# Patient Record
Sex: Male | Born: 1975 | Race: Black or African American | Hispanic: No | Marital: Single | Smoking: Current every day smoker
Health system: Southern US, Community
[De-identification: ages and names within clinical notes are randomized; demographics above are authoritative.]

## PROBLEM LIST (undated history)

## (undated) DIAGNOSIS — R569 Unspecified convulsions: Secondary | ICD-10-CM

## (undated) HISTORY — DX: Unspecified convulsions: R56.9

---

## 2007-12-31 ENCOUNTER — Emergency Department (HOSPITAL_COMMUNITY): Admission: EM | Admit: 2007-12-31 | Discharge: 2007-12-31 | Payer: Self-pay | Admitting: Emergency Medicine

## 2008-06-14 ENCOUNTER — Ambulatory Visit (HOSPITAL_COMMUNITY): Admission: RE | Admit: 2008-06-14 | Discharge: 2008-06-14 | Payer: Self-pay | Admitting: Neurology

## 2008-11-09 ENCOUNTER — Emergency Department (HOSPITAL_COMMUNITY): Admission: EM | Admit: 2008-11-09 | Discharge: 2008-11-10 | Payer: Self-pay | Admitting: Emergency Medicine

## 2010-04-16 ENCOUNTER — Encounter: Payer: Self-pay | Admitting: Neurology

## 2010-07-01 LAB — GLUCOSE, CAPILLARY: Glucose-Capillary: 96 mg/dL (ref 70–99)

## 2010-12-26 LAB — RAPID URINE DRUG SCREEN, HOSP PERFORMED
Barbiturates: NOT DETECTED
Benzodiazepines: NOT DETECTED
Cocaine: NOT DETECTED
Opiates: NOT DETECTED

## 2010-12-26 LAB — POCT I-STAT, CHEM 8
BUN: 6
Calcium, Ion: 1.16
Chloride: 105
Creatinine, Ser: 1.2
Glucose, Bld: 100 — ABNORMAL HIGH
HCT: 47
Hemoglobin: 16
Potassium: 3.7
Sodium: 140
TCO2: 29

## 2010-12-26 LAB — URINE MICROSCOPIC-ADD ON

## 2010-12-26 LAB — URINE CULTURE
Colony Count: NO GROWTH
Culture: NO GROWTH

## 2010-12-26 LAB — URINALYSIS, ROUTINE W REFLEX MICROSCOPIC
Nitrite: NEGATIVE
Protein, ur: 100 — AB
Specific Gravity, Urine: 1.022
Urobilinogen, UA: 0.2
pH: 5.5

## 2010-12-26 LAB — ETHANOL: Alcohol, Ethyl (B): <5

## 2013-02-02 ENCOUNTER — Emergency Department (HOSPITAL_BASED_OUTPATIENT_CLINIC_OR_DEPARTMENT_OTHER)
Admission: EM | Admit: 2013-02-02 | Discharge: 2013-02-02 | Disposition: A | Discharge status: Home / Self Care | Payer: Self-pay | Attending: Emergency Medicine | Admitting: Emergency Medicine

## 2013-02-02 ENCOUNTER — Encounter (HOSPITAL_BASED_OUTPATIENT_CLINIC_OR_DEPARTMENT_OTHER): Payer: Self-pay | Admitting: Emergency Medicine

## 2013-02-02 DIAGNOSIS — F1290 Cannabis use, unspecified, uncomplicated: Secondary | ICD-10-CM

## 2013-02-02 DIAGNOSIS — R569 Unspecified convulsions: Secondary | ICD-10-CM | POA: Insufficient documentation

## 2013-02-02 DIAGNOSIS — F17200 Nicotine dependence, unspecified, uncomplicated: Secondary | ICD-10-CM | POA: Insufficient documentation

## 2013-02-02 DIAGNOSIS — F1210 Cannabis abuse, uncomplicated: Secondary | ICD-10-CM | POA: Insufficient documentation

## 2013-02-02 LAB — GLUCOSE, CAPILLARY: Glucose-Capillary: 98 mg/dL (ref 70–99)

## 2013-02-02 LAB — BASIC METABOLIC PANEL
BUN: 10 mg/dL (ref 6–23)
CO2: 32 mEq/L (ref 19–32)
Chloride: 98 mEq/L (ref 96–112)
Creatinine, Ser: 0.9 mg/dL (ref 0.50–1.35)
GFR calc Af Amer: >90 mL/min (ref >90)
GFR calc non Af Amer: >90 mL/min (ref >90)
Potassium: 4 mEq/L (ref 3.5–5.1)
Sodium: 139 mEq/L (ref 135–145)

## 2013-02-02 LAB — RAPID URINE DRUG SCREEN, HOSP PERFORMED
Benzodiazepines: NOT DETECTED
Cocaine: NOT DETECTED

## 2013-02-02 NOTE — ED Provider Notes (Signed)
Medical screening examination/treatment/procedure(s) were performed by non-physician practitioner and as supervising physician I was immediately available for consultation/collaboration.    Nelia Shi, MD 02/02/13 1126

## 2013-02-02 NOTE — ED Notes (Addendum)
Patient and mother states this morning at approximately 0630, the patient had a grand mal seizure.  Mother states child was shaking all over, and incontinent of urine.  States the patient has had this same problem for over five years and has not seen any specialist due to lack of insurance. Does not take any medications. Mother states that after the seizures are over, the patient exhibits very confused behavior for several hours.

## 2013-02-02 NOTE — ED Provider Notes (Signed)
CSN: [id number]     Arrival date & time 02/02/13  7829 History   First MD Initiated Contact with Patient 02/02/13 [phone]     Chief Complaint  Patient presents with  . Seizures   (Consider location/radiation/quality/duration/timing/severity/associated sxs/prior Treatment) HPI Comments: Pt states that he has had seizures since 2009:pt states that he was worked up but her refuses to take medication:mother states that he has them a couple of times a month usually during sleep:mother states that he has one this morning and it lasted about 5 minutes;full body shaking:and then his confusion period lasted about 2 hours today which mother states is longer then normal:pt states that he feels fine now and he is still not will to take medications:pt was incontinent during the episode:denies fever, cough,n/v/d  The history is provided by the patient and a parent.    History reviewed. No pertinent past medical history. History reviewed. No pertinent past surgical history. No family history on file. History  Substance Use Topics  . Smoking status: Current Every Day Smoker -- 1.00 packs/day    Types: Cigarettes  . Smokeless tobacco: Never Used  . Alcohol Use: 0.0 oz/week    2-3 Cans of beer per week     Comment: daily    Review of Systems  Constitutional: Negative.   Respiratory: Negative.   Cardiovascular: Negative.     Allergies  Review of patient's allergies indicates no known allergies.  Home Medications  No current outpatient prescriptions on file. BP 116/88  Pulse 82  Temp(Src) 98.5 F (36.9 C) (Oral)  Resp 20  SpO2 99% Physical Exam  Nursing note and vitals reviewed. Constitutional: He is oriented to person, place, and time. He appears well-developed and well-nourished.  HENT:  Head: Normocephalic and atraumatic.  Eyes: Conjunctivae and EOM are normal. Pupils are equal, round, and reactive to light.  Cardiovascular: Normal rate and regular rhythm.   Pulmonary/Chest: Effort  normal and breath sounds normal.  Abdominal: Soft. Bowel sounds are normal.  Musculoskeletal: Normal range of motion.  Neurological: He is alert and oriented to person, place, and time. Coordination normal.  Skin: Skin is warm and dry.  Psychiatric: He has a normal mood and affect.    ED Course  Procedures (including critical care time) Labs Review Labs Reviewed  BASIC METABOLIC PANEL - Abnormal; Notable for the following:    Glucose, Bld 108 (*)    All other components within normal limits  URINE RAPID DRUG SCREEN (HOSP PERFORMED) - Abnormal; Notable for the following:    Tetrahydrocannabinol POSITIVE (*)    All other components within normal limits  GLUCOSE, CAPILLARY   Imaging Review No results found.  EKG Interpretation   None       MDM   1. Seizure   2. Marijuana use    Didn't speak with neurology as pt refusing medications    Teressa Lower, NP 02/02/13 1121

## 2013-02-10 ENCOUNTER — Ambulatory Visit: Payer: Self-pay

## 2013-02-16 ENCOUNTER — Encounter: Payer: Self-pay | Admitting: Internal Medicine

## 2013-02-16 ENCOUNTER — Ambulatory Visit: Payer: Self-pay | Attending: Internal Medicine | Admitting: Internal Medicine

## 2013-02-16 VITALS — BP 118/74 | HR 59 | Temp 98.8°F | Resp 16 | Wt 161.4 lb

## 2013-02-16 DIAGNOSIS — IMO0001 Reserved for inherently not codable concepts without codable children: Secondary | ICD-10-CM | POA: Insufficient documentation

## 2013-02-16 DIAGNOSIS — Z8669 Personal history of other diseases of the nervous system and sense organs: Secondary | ICD-10-CM

## 2013-02-16 DIAGNOSIS — F17200 Nicotine dependence, unspecified, uncomplicated: Secondary | ICD-10-CM | POA: Insufficient documentation

## 2013-02-16 DIAGNOSIS — F1290 Cannabis use, unspecified, uncomplicated: Secondary | ICD-10-CM

## 2013-02-16 DIAGNOSIS — Z87898 Personal history of other specified conditions: Secondary | ICD-10-CM | POA: Insufficient documentation

## 2013-02-16 DIAGNOSIS — F1210 Cannabis abuse, uncomplicated: Secondary | ICD-10-CM

## 2013-02-16 DIAGNOSIS — R569 Unspecified convulsions: Secondary | ICD-10-CM | POA: Insufficient documentation

## 2013-02-16 DIAGNOSIS — Z139 Encounter for screening, unspecified: Secondary | ICD-10-CM

## 2013-02-16 LAB — CBC WITH DIFFERENTIAL/PLATELET
Eosinophils Absolute: 0 10*3/uL (ref 0.0–0.7)
Hemoglobin: 16 g/dL (ref 13.0–17.0)
Lymphocytes Relative: 32 % (ref 12–46)
MCV: 90 fL (ref 78.0–100.0)
Monocytes Absolute: 0.2 10*3/uL (ref 0.1–1.0)
Monocytes Relative: 5 % (ref 3–12)
Neutrophils Relative %: 62 % (ref 43–77)
WBC: 4.6 10*3/uL (ref 4.0–10.5)

## 2013-02-16 NOTE — Progress Notes (Signed)
Patient ID: Ray Hill, male   DOB: 12/31/1975, 37 y.o.   MRN: [id number] MRN: [id number] Name: Ray Hill  Sex: male Age: 37 y.o. DOB: 1976-02-26  Allergies: Review of patient's allergies indicates no known allergies.  Chief Complaint  Patient presents with   Seizures    HPI: Patient is 37 y.o. male who   37 year old male who is accompanied with his mother comes today to establish medical care, recently went to the emergency room, reported to have seizure, electronic medical record reviewed, patient has had the seizures since 2009 and which usually occurs while asleep, patient is not on any seizure medications, as per mother this recent seizure episode was different as patient was more confused, did not had a tongue bite this time but had incontinence. Currently denies any headache dizziness chest pain shortness of breath or numbness, does smoke cigarettes every day and is trying to quit, does smoke marijuana.  Past Medical History  Diagnosis Date   Seizures     History reviewed. No pertinent past surgical history.    Medication List    Notice As of 02/16/2013 11:27 AM   You have not been prescribed any medications.      No orders of the defined types were placed in this encounter.     There is no immunization history on file for this patient.  History  Substance Use Topics   Smoking status: Current Every Day Smoker -- 1.00 packs/day    Types: Cigarettes   Smokeless tobacco: Never Used   Alcohol Use: 0.0 oz/week    2-3 Cans of beer per week     Comment: daily    Review of Systems  As noted in HPI  Filed Vitals:   02/16/13 1045  BP: 118/74  Pulse: 59  Temp: 98.8 F (37.1 C)  Resp: 16    Physical Exam  Physical Exam  Constitutional: He is oriented to person, place, and time. No distress.  Eyes: EOM are normal. Pupils are equal, round, and reactive to light.  Cardiovascular: Normal rate and normal heart sounds.   Pulmonary/Chest: Breath sounds  normal. No respiratory distress. He has no wheezes. He has no rales.  Neurological: He is alert and oriented to person, place, and time.  Equal strength in all limbs     CBC    Component Value Date/Time   HGB 16.0 12/31/2007 1107   HCT 47.0 12/31/2007 1107    CMP     Component Value Date/Time   NA 139 02/02/2013 1033   K 4.0 02/02/2013 1033   CL 98 02/02/2013 1033   CO2 32 02/02/2013 1033   GLUCOSE 108* 02/02/2013 1033   BUN 10 02/02/2013 1033   CREATININE 0.90 02/02/2013 1033   CALCIUM 9.7 02/02/2013 1033   GFRNONAA >90 02/02/2013 1033   GFRAA >90 02/02/2013 1033    No results found for this basename: chol, tri, ldl    No components found with this basename: hga1c    No results found for this basename: AST    Assessment and Plan  History of seizures - Plan: Ambulatory referral to Neurology at [facility] for further evaluation and management.  Smoking...Marland KitchenMarland Kitchen patient is trying to quit on his own, declined a nicotine patch  Screening - Plan: CBC with Differential, Vit D  25 hydroxy (rtn osteoporosis monitoring), TSH  Patient declined for flu shot.  Return in about 4 weeks (around 03/16/2013).  Doris Cheadle, MD

## 2013-02-16 NOTE — Progress Notes (Signed)
Patient here to establish care History of seizures Takes no prescribed medication States most of his seizures happen while he is sleeping

## 2013-02-16 NOTE — Patient Instructions (Signed)
sSeizure, Adult A seizure is abnormal electrical activity in the brain. Seizures can cause a change in attention or behavior (altered mental status). Seizures often involve uncontrollable shaking (convulsions). Seizures usually last from 30 seconds to 2 minutes. Epilepsy is a brain disorder in which a patient has repeated seizures over time. CAUSES  There are many different problems that can cause seizures. In some cases, no cause is found. Common causes of seizures include:  Head injuries.  Brain tumors.  Infections.  Imbalance of chemicals in the blood.  Kidney failure or liver failure.  Heart disease.  Drug abuse.  Stroke.  Withdrawal from certain drugs or alcohol.  Birth defects.  Malfunction of a neurosurgical device placed in the brain. SYMPTOMS  Symptoms vary depending on the part of the brain that is involved. Right before a seizure, you may have a warning (aura) that a seizure is about to occur. An aura may include the following symptoms:   Fear or anxiety.  Nausea.  Feeling like the room is spinning (vertigo).  Vision changes, such as seeing flashing lights or spots. Common symptoms during a seizure include:  Convulsions.  Drooling.  Rapid eye movements.  Grunting.  Loss of bladder and bowel control.  Bitter taste in the mouth. After a seizure, you may feel confused and sleepy. You may also have an injury resulting from convulsions during the seizure. DIAGNOSIS  Your caregiver will perform a physical exam and run some tests to determine the type and cause of your seizure. These tests may include:  Blood tests.  A lumbar puncture test. In this test, a small amount of fluid is removed from the spine and examined.  Electrocardiography (ECG). This test records the electrical activity in your heart.  Imaging tests, such as computed tomography (CT) scans or magnetic resonance imaging (MRI).  Electroencephalography (EEG). This test records the  electrical activity in your brain. TREATMENT  Seizures usually stop on their own. Treatment will depend on the cause of your seizure. In some cases, medicine may be given to prevent future seizures. HOME CARE INSTRUCTIONS   If you are given medicines, take them exactly as prescribed by your caregiver.  Keep all follow-up appointments as directed by your caregiver.  Do not swim or drive until your caregiver says it is okay.  Teach friends and family what to do if you have a seizure. They should:  Lay you on the ground to prevent a fall.  Put a cushion under your head.  Loosen any tight clothing around your neck.  Turn you on your side. If vomiting occurs, this helps keep your airway clear.  Stay with you until you recover. SEEK IMMEDIATE MEDICAL CARE IF:  The seizure lasts longer than 2 to 5 minutes.  The seizure is severe or the person does not wake up after the seizure.  The person has altered mental status. Drive the person to the emergency department or call your local emergency services (911 in [place] MAKE SURE YOU:  Understand these instructions.  Will watch your condition.  Will get help right away if you are not doing well or get worse. Document Released: 03/09/2000 Document Revised: 06/04/2011 Document Reviewed: 02/28/2011 [facility] Patient Information 2014 [place], [place].

## 2013-02-17 LAB — VITAMIN D 25 HYDROXY (VIT D DEFICIENCY, FRACTURES): Vit D, 25-Hydroxy: 18 ng/mL — ABNORMAL LOW (ref 30–89)

## 2013-03-24 ENCOUNTER — Ambulatory Visit: Payer: Self-pay | Admitting: Internal Medicine

## 2013-03-25 ENCOUNTER — Telehealth: Payer: Self-pay

## 2013-03-30 ENCOUNTER — Ambulatory Visit: Payer: Self-pay

## 2013-03-31 ENCOUNTER — Ambulatory Visit: Payer: Self-pay

## 2013-04-28 ENCOUNTER — Ambulatory Visit: Payer: Self-pay | Admitting: Internal Medicine

## 2014-01-02 ENCOUNTER — Emergency Department (HOSPITAL_COMMUNITY): Payer: Self-pay

## 2014-01-02 ENCOUNTER — Inpatient Hospital Stay (HOSPITAL_COMMUNITY)
Admission: EM | Admit: 2014-01-02 | Discharge: 2014-01-05 | DRG: 083 | Disposition: A | Discharge status: Home / Self Care | Payer: Self-pay | Attending: Internal Medicine | Admitting: Internal Medicine

## 2014-01-02 ENCOUNTER — Encounter (HOSPITAL_COMMUNITY): Payer: Self-pay | Admitting: Emergency Medicine

## 2014-01-02 DIAGNOSIS — Z8669 Personal history of other diseases of the nervous system and sense organs: Secondary | ICD-10-CM

## 2014-01-02 DIAGNOSIS — I609 Nontraumatic subarachnoid hemorrhage, unspecified: Secondary | ICD-10-CM

## 2014-01-02 DIAGNOSIS — Z87898 Personal history of other specified conditions: Secondary | ICD-10-CM

## 2014-01-02 DIAGNOSIS — T1490XA Injury, unspecified, initial encounter: Secondary | ICD-10-CM

## 2014-01-02 DIAGNOSIS — F17200 Nicotine dependence, unspecified, uncomplicated: Secondary | ICD-10-CM

## 2014-01-02 DIAGNOSIS — E872 Acidosis, unspecified: Secondary | ICD-10-CM

## 2014-01-02 DIAGNOSIS — S066X9A Traumatic subarachnoid hemorrhage with loss of consciousness of unspecified duration, initial encounter: Principal | ICD-10-CM | POA: Diagnosis present

## 2014-01-02 DIAGNOSIS — F17210 Nicotine dependence, cigarettes, uncomplicated: Secondary | ICD-10-CM | POA: Diagnosis present

## 2014-01-02 DIAGNOSIS — Z720 Tobacco use: Secondary | ICD-10-CM

## 2014-01-02 DIAGNOSIS — Z6820 Body mass index (BMI) 20.0-20.9, adult: Secondary | ICD-10-CM

## 2014-01-02 DIAGNOSIS — E860 Dehydration: Secondary | ICD-10-CM | POA: Diagnosis present

## 2014-01-02 DIAGNOSIS — F1290 Cannabis use, unspecified, uncomplicated: Secondary | ICD-10-CM

## 2014-01-02 DIAGNOSIS — G40909 Epilepsy, unspecified, not intractable, without status epilepticus: Secondary | ICD-10-CM

## 2014-01-02 DIAGNOSIS — G40209 Localization-related (focal) (partial) symptomatic epilepsy and epileptic syndromes with complex partial seizures, not intractable, without status epilepticus: Secondary | ICD-10-CM | POA: Diagnosis present

## 2014-01-02 DIAGNOSIS — W19XXXA Unspecified fall, initial encounter: Secondary | ICD-10-CM | POA: Diagnosis present

## 2014-01-02 LAB — RAPID URINE DRUG SCREEN, HOSP PERFORMED
Amphetamines: NOT DETECTED
BENZODIAZEPINES: POSITIVE — AB
Barbiturates: NOT DETECTED
COCAINE: NOT DETECTED
OPIATES: NOT DETECTED
Tetrahydrocannabinol: POSITIVE — AB

## 2014-01-02 LAB — BASIC METABOLIC PANEL
ANION GAP: 23 — AB (ref 5–15)
BUN: 13 mg/dL (ref 6–23)
CALCIUM: 9.2 mg/dL (ref 8.4–10.5)
CHLORIDE: 97 meq/L (ref 96–112)
CO2: 16 meq/L — AB (ref 19–32)
CREATININE: 1.12 mg/dL (ref 0.50–1.35)
GFR, EST NON AFRICAN AMERICAN: 82 mL/min — AB (ref >90)
GLUCOSE: 174 mg/dL — AB (ref 70–99)
Potassium: 3.7 mEq/L (ref 3.7–5.3)
SODIUM: 136 meq/L — AB (ref 137–147)

## 2014-01-02 LAB — CBC WITH DIFFERENTIAL/PLATELET
Basophils Absolute: 0 10*3/uL (ref 0.0–0.1)
Basophils Relative: 0 % (ref 0–1)
EOS PCT: 0 % (ref 0–5)
Eosinophils Absolute: 0 10*3/uL (ref 0.0–0.7)
HCT: 46.6 % (ref 39.0–52.0)
HEMOGLOBIN: 16.1 g/dL (ref 13.0–17.0)
LYMPHS ABS: 1.2 10*3/uL (ref 0.7–4.0)
LYMPHS PCT: 12 % (ref 12–46)
MCH: 31.8 pg (ref 26.0–34.0)
MCHC: 34.5 g/dL (ref 30.0–36.0)
MCV: 91.9 fL (ref 78.0–100.0)
MONO ABS: 0.4 10*3/uL (ref 0.1–1.0)
Monocytes Relative: 4 % (ref 3–12)
NEUTROS ABS: 8.4 10*3/uL — AB (ref 1.7–7.7)
Neutrophils Relative %: 84 % — ABNORMAL HIGH (ref 43–77)
PLATELETS: 198 10*3/uL (ref 150–400)
RBC: 5.07 MIL/uL (ref 4.22–5.81)
RDW: 12.8 % (ref 11.5–15.5)
WBC: 10 10*3/uL (ref 4.0–10.5)

## 2014-01-02 LAB — URINE MICROSCOPIC-ADD ON

## 2014-01-02 LAB — URINALYSIS, ROUTINE W REFLEX MICROSCOPIC
Bilirubin Urine: NEGATIVE
Glucose, UA: NEGATIVE mg/dL
Ketones, ur: NEGATIVE mg/dL
LEUKOCYTES UA: NEGATIVE
NITRITE: NEGATIVE
PROTEIN: 100 mg/dL — AB
Specific Gravity, Urine: 1.017 (ref 1.005–1.030)
Urobilinogen, UA: 0.2 mg/dL (ref 0.0–1.0)
pH: 5 (ref 5.0–8.0)

## 2014-01-02 LAB — CARBAMAZEPINE LEVEL, TOTAL: Carbamazepine Lvl: <0.5 ug/mL — ABNORMAL LOW (ref 4.0–12.0)

## 2014-01-02 LAB — CBG MONITORING, ED: GLUCOSE-CAPILLARY: 163 mg/dL — AB (ref 70–99)

## 2014-01-02 LAB — MAGNESIUM: Magnesium: 3 mg/dL — ABNORMAL HIGH (ref 1.5–2.5)

## 2014-01-02 MED ORDER — CARBAMAZEPINE 200 MG PO TABS: 200.0000 mg | ORAL_TABLET | Freq: Once | ORAL | Status: DC

## 2014-01-02 MED ORDER — SODIUM CHLORIDE 0.9 % IV BOLUS (SEPSIS)
1000.0000 mL | INTRAVENOUS | Status: AC
  Administered 2014-01-02: 1000 mL via INTRAVENOUS

## 2014-01-02 MED ORDER — LORAZEPAM 2 MG/ML IJ SOLN
1.0000 mg | INTRAMUSCULAR | Status: DC | PRN
  Administered 2014-01-02: 2 mg via INTRAVENOUS
  Filled 2014-01-02 (×2): qty 1

## 2014-01-02 MED ORDER — HYDROGEN PEROXIDE 3 % EX SOLN
CUTANEOUS | Status: AC
  Filled 2014-01-02: qty 473

## 2014-01-02 MED ORDER — SODIUM CHLORIDE 0.9 % IV SOLN: INTRAVENOUS | Status: DC

## 2014-01-02 MED ORDER — SODIUM CHLORIDE 0.9 % IV SOLN
INTRAVENOUS | Status: DC
  Administered 2014-01-02: 125 mL/h via INTRAVENOUS
  Administered 2014-01-04: 10:00:00 via INTRAVENOUS
  Administered 2014-01-04: 1000 mL via INTRAVENOUS

## 2014-01-02 MED ORDER — NICOTINE 21 MG/24HR TD PT24
21.0000 mg | MEDICATED_PATCH | Freq: Every day | TRANSDERMAL | Status: DC
  Administered 2014-01-02 – 2014-01-05 (×4): 21 mg via TRANSDERMAL
  Filled 2014-01-02 (×4): qty 1

## 2014-01-02 MED ORDER — MIDAZOLAM HCL 2 MG/2ML IJ SOLN
INTRAMUSCULAR | Status: AC
  Administered 2014-01-02: 5 mg
  Filled 2014-01-02: qty 6

## 2014-01-02 NOTE — ED Notes (Signed)
Patient now more oriented.  Less combative.  Follows commands.  Remains sleepy drifting in and out.  MD at bedside earlier explaining that patient will be admitted and probably be [facility].

## 2014-01-02 NOTE — ED Provider Notes (Signed)
Medical screening examination/treatment/procedure(s) were conducted as a shared visit with non-physician practitioner(s) and myself.  I personally evaluated the patient during the encounter.   EKG Interpretation None     38yM with witnessed seizure and fall. Hx of same. Reports typically has about 1x/month. On zonegran. Struck head when fell. Imaging significant for small SAH R Sylvian fissure. Mild confusion on my exam. Could be from post-ictal state, head injury, meds or some combination of any. Given versed prior to arrival by EMS as he was combative. Neuro exam nonfocal otherwise. Scalp lac which was stapled. No midline spinal tenderness. Denies neck pain. CT cervical spine w/o acute abnormality.   11:08 PM Initial plan to [facility]. No stepdown beds available. From my perspective I do not feel he requires an intensive care bed. Discussed with Dr Lovell SheehanJenkins with neurosurgery again. He is fine with pt being transferred to a med surg bed. Pt just reassessed by myself. He has been in ED here over 8 hours now. He has no new complaints. Sitting up in bed watching tv. AOx3. Speech clear. Content appropriate. Follows commands. No focal motor deficit.   Raeford RazorStephen Aigner Horseman, MD 01/02/14 2322

## 2014-01-02 NOTE — ED Notes (Signed)
[facility] say he fells backwards hitting his head and then having a grand mal seizure lasting one minute, then he had another one lasting 15 seconds.  Patient was combative with EMS and extremely post-ictal.  Patient had 5mg  Haldol and 5mg  of Versed by EMS and was still combative upon arrival trying to crawl off stretcher and taking off 02 mask.  MD notified in ED and another 5 of Versed given IV.  Patient sleeping now with vital signs stable.  Family at bedside.

## 2014-01-02 NOTE — H&P (Signed)
[facility] History and Physical  Primo Innis WUJ:[id number] DOB: Oct 11, 1975 DOA: 01/02/2014  Referring physician: Dr Juleen China PCP: Jeanann Lewandowsky, MD / Neurologist: [facility]  Chief Complaint: Seizures  HPI: Yee Joss is a 38 y.o. male  With history of complex partial seizures which per neurologist note from [facility], was noted on EEG arising from the right temporal hemisphere. Patient presents to the emergency room with a witnessed seizure noted at the [facility] where patient was noted to have a grand mal seizure for approximately a minute was subsequently stopped and then another one lasting a few seconds. Patient was noted to have fallen and struck his head from the seizure. Patient was reportedly combative and had received Haldol in the field of Versed in the field per EMS. When patient was brought to the emergency room he was postictal. It was noted the ED PA and patient also did have a seizure the day prior to admission in his sleep and one approximately a month ago. During my interview patient is currently alert, following commands. Patient does not remember what happened and stated that he was told he had a seizure at the [place]. Patient denies any alcohol intake. No fevers, no chills, no nausea, no vomiting, no chest pain, no shortness of breath, no diarrhea, no constipation, no dysuria, no abdominal pain, no cough, no melena, no hematemesis, no hematochezia, no headaches. Patient states was on Tegretol which was subsequently changed to zonisamide 100 mg daily. Patient states she's been compliant with his medications. Patient was seen in the emergency room basic metabolic profile done at a sodium of 136 bicarbonate of 16 otherwise was within normal limits. CBC was unremarkable. Tegretol level was less than 0.5. Glucose level was at 174. Chest x-ray that was negative for any acute cardiopulmonary disease. CT of the head and C-spine done showed a small amount of subarachnoid  hemorrhage in the right sylvian fissure without mass effect or midline shift. No acute cervical spine fracture or subluxation. EDPA status she spoke with Dr. Lovell Sheehan of neurosurgery who had recommended that patient be admitted by day hospitalist service and he would consult. The hospitalist service was subsequently called to admit the patient for further evaluation and management.    Review of Systems: As per history of present illness otherwise negative. Constitutional:  No weight loss, night sweats, Fevers, chills, fatigue.  HEENT:  No headaches, Difficulty swallowing,Tooth/dental problems,Sore throat,  No sneezing, itching, ear ache, nasal congestion, post nasal drip,  Cardio-vascular:  No chest pain, Orthopnea, PND, swelling in lower extremities, anasarca, dizziness, palpitations  GI:  No heartburn, indigestion, abdominal pain, nausea, vomiting, diarrhea, change in bowel habits, loss of appetite  Resp:  No shortness of breath with exertion or at rest. No excess mucus, no productive cough, No non-productive cough, No coughing up of blood.No change in color of mucus.No wheezing.No chest wall deformity  Skin:  no rash or lesions.  GU:  no dysuria, change in color of urine, no urgency or frequency. No flank pain.  Musculoskeletal:  No joint pain or swelling. No decreased range of motion. No back pain.  Psych:  No change in mood or affect. No depression or anxiety. No memory loss.   Past Medical History  Diagnosis Date  . Seizures    History reviewed. No pertinent past surgical history. Social History:  reports that he has been smoking Cigarettes.  He has been smoking about 1.00 pack per day. He has never used smokeless tobacco. He reports that he drinks alcohol. He  reports that he uses illicit drugs (Marijuana).  No Known Allergies  Family History  Problem Relation Age of Onset  . Cancer Maternal Grandmother     ovarian cancer      Prior to Admission medications     Medication Sig Start Date End Date Taking? Authorizing Provider  zonisamide (ZONEGRAN) 50 MG capsule Take 100 mg by mouth daily. TAKE ONE CAPSULE DAILY FOR ONE WEEK, THEN INCREASE TO TWO CAPSULES DAILY 11/20/13  Yes Historical Provider, MD   Physical Exam: Filed Vitals:   01/02/14 1700 01/02/14 1717 01/02/14 1732 01/02/14 1800  BP: 102/51 102/51 110/71 100/74  Pulse: 96 95 100 102  Resp: 17 18 18 18   SpO2: 93% 100% 99% 98%    Wt Readings from Last 3 Encounters:  02/16/13 73.211 kg (161 lb 6.4 oz)    General:  Well-developed well-nourished laying on the gurney in no acute cardiopulmonary distress.  Eyes: PERRLA, EOMI, normal lids, irises & conjunctiva ENT: grossly normal hearing, lips & tongue Neck: no LAD, masses or thyromegaly Cardiovascular: RRR, no m/r/g. No LE edema. Respiratory: CTA bilaterally, no w/r/r. Normal respiratory effort. Abdomen: soft, ntnd, positive bowel sounds, no rebound, no guarding Skin: no rash or induration seen on limited exam Musculoskeletal: grossly normal tone BUE/BLE Psychiatric: grossly normal mood and affect, speech fluent and appropriate Neurologic: Alert and oriented x3. Cranial nerves II through XII are grossly intact. Sensation is intact. Visual fields are intact. Unable to elicit reflexes symmetrically and diffusely. Gait not tested secondary to safety.           Labs on Admission:  Basic Metabolic Panel:  Recent Labs Lab 01/02/14 1528  NA 136*  K 3.7  CL 97  CO2 16*  GLUCOSE 174*  BUN 13  CREATININE 1.12  CALCIUM 9.2  MG 3.0*   Liver Function Tests: No results found for this basename: AST, ALT, ALKPHOS, BILITOT, PROT, ALBUMIN,  in the last 168 hours No results found for this basename: LIPASE, AMYLASE,  in the last 168 hours No results found for this basename: AMMONIA,  in the last 168 hours CBC:  Recent Labs Lab 01/02/14 1528  WBC 10.0  NEUTROABS 8.4*  HGB 16.1  HCT 46.6  MCV 91.9  PLT 198   Cardiac Enzymes: No  results found for this basename: CKTOTAL, CKMB, CKMBINDEX, TROPONINI,  in the last 168 hours  BNP (last 3 results) No results found for this basename: PROBNP,  in the last 8760 hours CBG:  Recent Labs Lab 01/02/14 1519  GLUCAP 163*    Radiological Exams on Admission: Dg Chest 2 View  01/02/2014   CLINICAL DATA:  Status post fall backwards, hitting his head. Grand mal seizure.  EXAM: CHEST  2 VIEW  COMPARISON:  None.  FINDINGS: The heart size and mediastinal contours are within normal limits. There is no focal infiltrate, pulmonary edema, or pleural effusion. There are chronic changes of the proximal left humerus. No acute fracture or dislocation is seen.  IMPRESSION: No active cardiopulmonary disease.   Electronically Signed   By: Sherian ReinWei-Chen  Lin M.D.   On: 01/02/2014 16:50   Ct Head Wo Contrast  01/02/2014   CLINICAL DATA:  Seizures, head injury  EXAM: CT HEAD WITHOUT CONTRAST  CT CERVICAL SPINE WITHOUT CONTRAST  TECHNIQUE: Multidetector CT imaging of the head and cervical spine was performed following the standard protocol without intravenous contrast. Multiplanar CT image reconstructions of the cervical spine were also generated.  COMPARISON:  None.  FINDINGS: CT HEAD  FINDINGS  No skull fracture is noted. There is scalp swelling and subcutaneous hematoma in right posterior parietal scalp. The hematoma measures 3.8 cm length by 8 mm thickness.  No mass effect or midline shift. In axial image 10 and 11 there is tiny amount of subarachnoid hemorrhage in right sylvian fissure. No intraventricular hemorrhage.  CT CERVICAL SPINE FINDINGS  Axial images of the cervical spine shows no acute fracture or subluxation. Computer processed images shows no acute fracture or subluxation. Alignment, disc spaces and vertebral heights are preserved. No prevertebral soft tissue swelling. Cervical airway is patent.  IMPRESSION: 1. There is no mass effect or midline shift. There is small amount of subarachnoid  hemorrhage in right sylvian fissure without mass effect. No intraventricular hemorrhage. Although subarachnoid hemorrhage may be post traumatic in nature ruptured intracranial aneurysm cannot be entirely excluded. Follow-up examination should be based on clinical grounds. No skull fracture is noted. There is scalp swelling and small subcutaneous hematoma in right posterior parietal scalp. 2. No cervical spine acute fracture or subluxation. No prevertebral soft tissue swelling. These results were called by telephone at the time of interpretation on 01/02/2014 at 4:50 pm to Dr. Harle BattiestELIZABETH TYSINGER , who verbally acknowledged these results.   Electronically Signed   By: Natasha MeadLiviu  Pop M.D.   On: 01/02/2014 16:51   Ct Cervical Spine Wo Contrast  01/02/2014   CLINICAL DATA:  Seizures, head injury  EXAM: CT HEAD WITHOUT CONTRAST  CT CERVICAL SPINE WITHOUT CONTRAST  TECHNIQUE: Multidetector CT imaging of the head and cervical spine was performed following the standard protocol without intravenous contrast. Multiplanar CT image reconstructions of the cervical spine were also generated.  COMPARISON:  None.  FINDINGS: CT HEAD FINDINGS  No skull fracture is noted. There is scalp swelling and subcutaneous hematoma in right posterior parietal scalp. The hematoma measures 3.8 cm length by 8 mm thickness.  No mass effect or midline shift. In axial image 10 and 11 there is tiny amount of subarachnoid hemorrhage in right sylvian fissure. No intraventricular hemorrhage.  CT CERVICAL SPINE FINDINGS  Axial images of the cervical spine shows no acute fracture or subluxation. Computer processed images shows no acute fracture or subluxation. Alignment, disc spaces and vertebral heights are preserved. No prevertebral soft tissue swelling. Cervical airway is patent.  IMPRESSION: 1. There is no mass effect or midline shift. There is small amount of subarachnoid hemorrhage in right sylvian fissure without mass effect. No intraventricular  hemorrhage. Although subarachnoid hemorrhage may be post traumatic in nature ruptured intracranial aneurysm cannot be entirely excluded. Follow-up examination should be based on clinical grounds. No skull fracture is noted. There is scalp swelling and small subcutaneous hematoma in right posterior parietal scalp. 2. No cervical spine acute fracture or subluxation. No prevertebral soft tissue swelling. These results were called by telephone at the time of interpretation on 01/02/2014 at 4:50 pm to Dr. Harle BattiestELIZABETH TYSINGER , who verbally acknowledged these results.   Electronically Signed   By: Natasha MeadLiviu  Pop M.D.   On: 01/02/2014 16:51    EKG: None  Assessment/Plan Principal Problem:   SAH (subarachnoid hemorrhage) Active Problems:   History of seizures   Smoking   Complex partial seizures   Acidosis, metabolic   #1 subarachnoid hemorrhage Secondary to a fall secondary to seizures. Patient currently with no focal neurological deficits. EDPA stated she spoke with Dr. Lovell SheehanJenkins of neurosurgery recommended [facility] and neurosurgery to consult. Will admit the patient to  step down unit. Patient will likely need a head CT repeat in 24 hours. Consult with neurosurgery. Neuro checks every 4 hours. Follow.  #2 seizures Patient does have a history of complex partial seizures. Per neurology's note from 07/31/2013 from the [facility] complex partial seizures were noted on EEG which arose from the right temporal hemisphere. Patient is supposed to be on zonisamide 100 mg daily which he states he's been compliant with. UDS was positive for benzos and THC. Will place patient under seizure precautions. Follow electrolytes. Chest x-ray was negative. Check a urinalysis to rule out UTI. Ativan as needed. Consult with neurology for further evaluation and management. Neuro checks.  #3 metabolic acidosis Likely secondary to problem #2. Hydrate with IV fluids. Will follow.  #4 tobacco  abuse Tobacco cessation. Place on a nicotine patch.  #5 prophylaxis Lovenox for DVT prophylaxis.   Code Status: Full DVT Prophylaxis: Lovenox. Family Communication: Updated patient no family at bedside. Disposition Plan: Admit to [facility]  Time spent: 65 mins  [facility] MD [facility] Pager [phone]

## 2014-01-02 NOTE — ED Notes (Signed)
Patient is real agitated. Ativan given to help patient calm down.

## 2014-01-02 NOTE — ED Provider Notes (Signed)
CSN: [id number]     Arrival date & time 01/02/14  1447 History   First MD Initiated Contact with Patient 01/02/14 1506     Chief Complaint  Patient presents with  . Seizures   (Consider location/radiation/quality/duration/timing/severity/associated sxs/prior Treatment) HPI 38 yo male presenting from EMS with report of seizure activity about 30 min PTA.  EMS states he was [place] the laundry [place] and was witnessed having a grand mal seizure for appr 1 minute then stopping and another one lasting a few seconds.  He was reportedly combative and received Halodol in the field and Versed in the ED.  He may have hit his head in the process.  His mother states he takes 2 pills for his seizures every day, but she is not sure of the name.  She reports he did have a seizure yesterday and the last one before that was appr 1 month ago.  The mother spoke to pharmacy and he has 2 prescriptions on file: Tegretol and Zonegran. He lives with her and she denies any ETOH intake in several months, also denies recent illness, nausea, vomiting, fever, or headaches.     Past Medical History  Diagnosis Date  . Seizures    No past surgical history on file. Family History  Problem Relation Age of Onset  . Cancer Maternal Grandmother     ovarian cancer    History  Substance Use Topics  . Smoking status: Current Every Day Smoker -- 1.00 packs/day    Types: Cigarettes  . Smokeless tobacco: Never Used  . Alcohol Use: 0.0 oz/week    2-3 Cans of beer per week     Comment: daily    Review of Systems  Unable to perform ROS: Other    Allergies  Review of patient's allergies indicates no known allergies.  Home Medications   Prior to Admission medications   Not on File   BP 115/66  Pulse 103  Resp 16  SpO2 97% Physical Exam  Nursing note and vitals reviewed. Constitutional: He appears well-developed and well-nourished. He appears lethargic. He is easily aroused. No distress.  Pt opens his eyes easily, but  does not follow commands or answer questions.  He received Haldol and versed for combative behavior.    HENT:  Head: Normocephalic.    Right Ear: Tympanic membrane normal.  Left Ear: Tympanic membrane normal.  Mouth/Throat: Oropharynx is clear and moist. No oropharyngeal exudate.  Eyes: Conjunctivae are normal.  Pupils pinpoint and sluggish  Neck: Neck supple. No thyromegaly present.  Cardiovascular: Normal rate, regular rhythm, S1 normal, S2 normal and intact distal pulses.  Exam reveals no gallop and no friction rub.   No murmur heard. Pulmonary/Chest: Effort normal and breath sounds normal. No respiratory distress. He has no wheezes. He has no rales. He exhibits no tenderness, no crepitus, no deformity and no swelling.    Abdominal: Soft. There is no tenderness.  Musculoskeletal: He exhibits no tenderness.  Lymphadenopathy:    He has no cervical adenopathy.  Neurological: He is easily aroused. He appears lethargic.  Skin: Skin is warm and dry. No rash noted. He is not diaphoretic.  Psychiatric: He has a normal mood and affect.    ED Course  Procedures (including critical care time) LACERATION REPAIR Performed by: Harle Battiestysinger, Janita Camberos Authorized by: Harle Battiestysinger, Assia Meanor Consent: Verbal consent obtained. Risks and benefits: risks, benefits and alternatives were discussed Consent given by: patient Patient identity confirmed: provided demographic data Prepped and Draped in normal clean fashion Wound explored  Laceration Location: posterior parietal scalp  Laceration Length: 2.5 cm, 1.5 cm  No Foreign Bodies seen or palpated  Anesthesia: none  Local anesthetic: N/A  Anesthetic total: N/A  Irrigation method: syringe Amount of cleaning: standard  Skin closure: slaples  Number of staples: 2.5 cm lac: 3, 1.5 cm lac: 2   Patient tolerance: Patient tolerated the procedure well with no immediate complications.   Labs Review Labs Reviewed  URINE RAPID DRUG SCREEN  (HOSP PERFORMED) - Abnormal; Notable for the following:    Benzodiazepines POSITIVE (*)    Tetrahydrocannabinol POSITIVE (*)    All other components within normal limits  CBC WITH DIFFERENTIAL - Abnormal; Notable for the following:    Neutrophils Relative % 84 (*)    Neutro Abs 8.4 (*)    All other components within normal limits  BASIC METABOLIC PANEL - Abnormal; Notable for the following:    Sodium 136 (*)    CO2 16 (*)    Glucose, Bld 174 (*)    GFR calc non Af Amer 82 (*)    Anion gap 23 (*)    All other components within normal limits  CARBAMAZEPINE LEVEL, TOTAL - Abnormal; Notable for the following:    Carbamazepine Lvl <0.5 (*)    All other components within normal limits  CBG MONITORING, ED - Abnormal; Notable for the following:    Glucose-Capillary 163 (*)    All other components within normal limits  URINE CULTURE  URINALYSIS, ROUTINE W REFLEX MICROSCOPIC  MAGNESIUM    Imaging Review Dg Chest 2 View  01/02/2014   CLINICAL DATA:  Status post fall backwards, hitting his head. Grand mal seizure.  EXAM: CHEST  2 VIEW  COMPARISON:  None.  FINDINGS: The heart size and mediastinal contours are within normal limits. There is no focal infiltrate, pulmonary edema, or pleural effusion. There are chronic changes of the proximal left humerus. No acute fracture or dislocation is seen.  IMPRESSION: No active cardiopulmonary disease.   Electronically Signed   By: Sherian ReinWei-Chen  Lin M.D.   On: 01/02/2014 16:50   Ct Head Wo Contrast  01/02/2014   CLINICAL DATA:  Seizures, head injury  EXAM: CT HEAD WITHOUT CONTRAST  CT CERVICAL SPINE WITHOUT CONTRAST  TECHNIQUE: Multidetector CT imaging of the head and cervical spine was performed following the standard protocol without intravenous contrast. Multiplanar CT image reconstructions of the cervical spine were also generated.  COMPARISON:  None.  FINDINGS: CT HEAD FINDINGS  No skull fracture is noted. There is scalp swelling and subcutaneous hematoma  in right posterior parietal scalp. The hematoma measures 3.8 cm length by 8 mm thickness.  No mass effect or midline shift. In axial image 10 and 11 there is tiny amount of subarachnoid hemorrhage in right sylvian fissure. No intraventricular hemorrhage.  CT CERVICAL SPINE FINDINGS  Axial images of the cervical spine shows no acute fracture or subluxation. Computer processed images shows no acute fracture or subluxation. Alignment, disc spaces and vertebral heights are preserved. No prevertebral soft tissue swelling. Cervical airway is patent.  IMPRESSION: 1. There is no mass effect or midline shift. There is small amount of subarachnoid hemorrhage in right sylvian fissure without mass effect. No intraventricular hemorrhage. Although subarachnoid hemorrhage may be post traumatic in nature ruptured intracranial aneurysm cannot be entirely excluded. Follow-up examination should be based on clinical grounds. No skull fracture is noted. There is scalp swelling and small subcutaneous hematoma in right posterior parietal scalp. 2. No cervical spine acute fracture or  subluxation. No prevertebral soft tissue swelling. These results were called by telephone at the time of interpretation on 01/02/2014 at 4:50 pm to Dr. Harle Battiest , who verbally acknowledged these results.   Electronically Signed   By: Natasha Mead M.D.   On: 01/02/2014 16:51   Ct Cervical Spine Wo Contrast  01/02/2014   CLINICAL DATA:  Seizures, head injury  EXAM: CT HEAD WITHOUT CONTRAST  CT CERVICAL SPINE WITHOUT CONTRAST  TECHNIQUE: Multidetector CT imaging of the head and cervical spine was performed following the standard protocol without intravenous contrast. Multiplanar CT image reconstructions of the cervical spine were also generated.  COMPARISON:  None.  FINDINGS: CT HEAD FINDINGS  No skull fracture is noted. There is scalp swelling and subcutaneous hematoma in right posterior parietal scalp. The hematoma measures 3.8 cm length by 8 mm  thickness.  No mass effect or midline shift. In axial image 10 and 11 there is tiny amount of subarachnoid hemorrhage in right sylvian fissure. No intraventricular hemorrhage.  CT CERVICAL SPINE FINDINGS  Axial images of the cervical spine shows no acute fracture or subluxation. Computer processed images shows no acute fracture or subluxation. Alignment, disc spaces and vertebral heights are preserved. No prevertebral soft tissue swelling. Cervical airway is patent.  IMPRESSION: 1. There is no mass effect or midline shift. There is small amount of subarachnoid hemorrhage in right sylvian fissure without mass effect. No intraventricular hemorrhage. Although subarachnoid hemorrhage may be post traumatic in nature ruptured intracranial aneurysm cannot be entirely excluded. Follow-up examination should be based on clinical grounds. No skull fracture is noted. There is scalp swelling and small subcutaneous hematoma in right posterior parietal scalp. 2. No cervical spine acute fracture or subluxation. No prevertebral soft tissue swelling. These results were called by telephone at the time of interpretation on 01/02/2014 at 4:50 pm to Dr. Harle Battiest , who verbally acknowledged these results.   Electronically Signed   By: Natasha Mead M.D.   On: 01/02/2014 16:51     EKG Interpretation None      MDM   Final diagnoses:  Trauma  Seizure disorder  Subarachnoid hemorrhage   38 yo male presenting after seizure activity.  Pt is responsive to verbal stimluli currently because of sedation meds received in the field.  On initial exam, does not follow commands.  No acute distress, managing airway, vital signs stable.  Case discussed with Dr. Juleen China.   CBG: 163,  UDS: THC CBC: without significant abnormality,  BMP: Anion gap: 23, 1 liter NS bolus given Tegretol level: negligible   Chest x-ray: no acute findings and CT head and C-spine  Will re-examine when more responsive.    3:56 PM pt more responsive  but not following commands, will place soft restraints until pt more coherent.    5:00 PM pt now answering questions and can follow commands, but still lethargic.  Received call from radiologist regarding small subarachnoid hemorrhage on CT scan, C-spine negative. Wounds on scalp explored, staples for skin closure placed.  5:25 PM Spoke to Dr. Lovell Sheehan with neurosurgery regarding pt, they will consult but would like pt admitted to hospitalist.  5:45 PM Spoke to Hospitalist, pt will be admitted to their service, Pt should be transferred to [facility] to a step-down floor.   Pt and family made aware of plan.    Pt awaiting transfer to [facility] for admission.  Delay due to bed availability  11:00 PM On re-eval, pt alert and oriented, no focal neuro  deficits.  Discussed with NS, pt could be transferred to floor bed instead of step-down. Filed Vitals:   01/02/14 2040 01/02/14 2104 01/02/14 2120 01/02/14 2140  BP: 113/72 117/67 119/61 116/57  Pulse: 114 97 102 102  Temp:      TempSrc:      Resp: 25 15 23 22   SpO2: 99% 96% 97% 97%   01/03/14 0121  107/67  95  98.4 F (36.9 C)  Oral  18  6\' 2"  (1.88 m)  158 lb 4.8 oz (71.804 kg)  100%       Harle Battiest, NP 01/03/14 1245

## 2014-01-02 NOTE — ED Notes (Signed)
Released left arm restraint so patient could turn on side and get more comfortable.  Family at bedside.

## 2014-01-02 NOTE — ED Notes (Signed)
Bed: [id number] Expected date: 01/02/14 Expected time: 2:42 PM Means of arrival: Ambulance Comments: Post ictal-combative

## 2014-01-03 ENCOUNTER — Inpatient Hospital Stay (HOSPITAL_COMMUNITY): Payer: Self-pay

## 2014-01-03 ENCOUNTER — Encounter (HOSPITAL_COMMUNITY): Payer: Self-pay | Admitting: *Deleted

## 2014-01-03 DIAGNOSIS — G40209 Localization-related (focal) (partial) symptomatic epilepsy and epileptic syndromes with complex partial seizures, not intractable, without status epilepticus: Secondary | ICD-10-CM

## 2014-01-03 DIAGNOSIS — T1490 Injury, unspecified: Secondary | ICD-10-CM

## 2014-01-03 LAB — CBC
HCT: 40.6 % (ref 39.0–52.0)
HCT: 41.2 % (ref 39.0–52.0)
HEMOGLOBIN: 13.9 g/dL (ref 13.0–17.0)
Hemoglobin: 13.9 g/dL (ref 13.0–17.0)
MCH: 30.9 pg (ref 26.0–34.0)
MCH: 31.3 pg (ref 26.0–34.0)
MCHC: 33.7 g/dL (ref 30.0–36.0)
MCHC: 34.2 g/dL (ref 30.0–36.0)
MCV: 91.4 fL (ref 78.0–100.0)
MCV: 91.6 fL (ref 78.0–100.0)
PLATELETS: 180 10*3/uL (ref 150–400)
PLATELETS: 182 10*3/uL (ref 150–400)
RBC: 4.44 MIL/uL (ref 4.22–5.81)
RBC: 4.5 MIL/uL (ref 4.22–5.81)
RDW: 12.9 % (ref 11.5–15.5)
RDW: 12.9 % (ref 11.5–15.5)
WBC: 12.8 10*3/uL — AB (ref 4.0–10.5)
WBC: 12.6 10*3/uL — ABNORMAL HIGH (ref 4.0–10.5)

## 2014-01-03 LAB — COMPREHENSIVE METABOLIC PANEL
ALK PHOS: 70 U/L (ref 39–117)
ALT: 23 U/L (ref 0–53)
ANION GAP: 15 (ref 5–15)
AST: 51 U/L — ABNORMAL HIGH (ref 0–37)
Albumin: 3.3 g/dL — ABNORMAL LOW (ref 3.5–5.2)
BUN: 11 mg/dL (ref 6–23)
CO2: 20 mEq/L (ref 19–32)
Calcium: 8.6 mg/dL (ref 8.4–10.5)
Chloride: 106 mEq/L (ref 96–112)
Creatinine, Ser: 1.23 mg/dL (ref 0.50–1.35)
GFR calc non Af Amer: 74 mL/min — ABNORMAL LOW (ref >90)
GFR, EST AFRICAN AMERICAN: 85 mL/min — AB (ref >90)
GLUCOSE: 88 mg/dL (ref 70–99)
Potassium: 3.9 mEq/L (ref 3.7–5.3)
Sodium: 141 mEq/L (ref 137–147)
TOTAL PROTEIN: 6.5 g/dL (ref 6.0–8.3)
Total Bilirubin: 0.5 mg/dL (ref 0.3–1.2)

## 2014-01-03 LAB — TSH: TSH: 0.858 u[IU]/mL (ref 0.350–4.500)

## 2014-01-03 LAB — CREATININE, SERUM
Creatinine, Ser: 1.27 mg/dL (ref 0.50–1.35)
GFR calc non Af Amer: 71 mL/min — ABNORMAL LOW (ref >90)
GFR, EST AFRICAN AMERICAN: 82 mL/min — AB (ref >90)

## 2014-01-03 LAB — PHOSPHORUS: Phosphorus: 3.2 mg/dL (ref 2.3–4.6)

## 2014-01-03 LAB — LACTIC ACID, PLASMA: LACTIC ACID, VENOUS: 0.6 mmol/L (ref 0.5–2.2)

## 2014-01-03 LAB — GLUCOSE, CAPILLARY: GLUCOSE-CAPILLARY: 90 mg/dL (ref 70–99)

## 2014-01-03 MED ORDER — SORBITOL 70 % SOLN
30.0000 mL | Freq: Every day | Status: DC | PRN
Start: 2014-01-03 — End: 2014-01-05
  Filled 2014-01-03: qty 30

## 2014-01-03 MED ORDER — ZONISAMIDE 100 MG PO CAPS
200.0000 mg | ORAL_CAPSULE | Freq: Every day | ORAL | Status: DC
  Administered 2014-01-03 – 2014-01-05 (×3): 200 mg via ORAL
  Filled 2014-01-03 (×3): qty 2

## 2014-01-03 MED ORDER — ONDANSETRON HCL 4 MG/2ML IJ SOLN
4.0000 mg | Freq: Four times a day (QID) | INTRAMUSCULAR | Status: DC | PRN
Start: 2014-01-03 — End: 2014-01-05

## 2014-01-03 MED ORDER — SODIUM CHLORIDE 0.9 % IJ SOLN
3.0000 mL | Freq: Two times a day (BID) | INTRAMUSCULAR | Status: DC
  Administered 2014-01-04 – 2014-01-05 (×2): 3 mL via INTRAVENOUS

## 2014-01-03 MED ORDER — ACETAMINOPHEN 650 MG RE SUPP: 650.0000 mg | Freq: Four times a day (QID) | RECTAL | Status: DC | PRN

## 2014-01-03 MED ORDER — ONDANSETRON HCL 4 MG PO TABS: 4.0000 mg | ORAL_TABLET | Freq: Four times a day (QID) | ORAL | Status: DC | PRN

## 2014-01-03 MED ORDER — ALBUTEROL SULFATE (2.5 MG/3ML) 0.083% IN NEBU: 2.5000 mg | INHALATION_SOLUTION | RESPIRATORY_TRACT | Status: DC | PRN

## 2014-01-03 MED ORDER — ENOXAPARIN SODIUM 40 MG/0.4ML ~~LOC~~ SOLN
40.0000 mg | SUBCUTANEOUS | Status: DC
  Administered 2014-01-03 – 2014-01-04 (×2): 40 mg via SUBCUTANEOUS
  Filled 2014-01-03 (×2): qty 0.4

## 2014-01-03 MED ORDER — MAGNESIUM CITRATE PO SOLN: 1.0000 | Freq: Once | ORAL | Status: AC | PRN

## 2014-01-03 MED ORDER — ACETAMINOPHEN 325 MG PO TABS: 650.0000 mg | ORAL_TABLET | Freq: Four times a day (QID) | ORAL | Status: DC | PRN

## 2014-01-03 MED ORDER — IPRATROPIUM BROMIDE 0.02 % IN SOLN: 0.5000 mg | RESPIRATORY_TRACT | Status: DC | PRN

## 2014-01-03 MED ORDER — HYDROCODONE-ACETAMINOPHEN 5-325 MG PO TABS: 1.0000 | ORAL_TABLET | ORAL | Status: DC | PRN

## 2014-01-03 MED ORDER — POLYETHYLENE GLYCOL 3350 17 G PO PACK: 17.0000 g | PACK | Freq: Every day | ORAL | Status: DC | PRN

## 2014-01-03 NOTE — Progress Notes (Signed)
[facility]. Patient drowsy but able to follow commands and answer questions. Patient accompanied by mother. Will continue to monitor.

## 2014-01-03 NOTE — Consult Note (Signed)
Reason for Consult:small traumatic right subarachnoid hemorrhage Referring Physician: Dr. Rosemarie Ax is an 38 y.o. male.  HPI: the patient is a 38 year old black male who has a history of seizure disorders followed by a neurologist in [place]. The patient had a seizure yesterday and fell and struck his head. He was brought to [facility] emergency department where a head CT scan demonstrated a minimal right traumatic subarachnoid hemorrhage. The patient was admitted for observation. A neurosurgical consultation has been requested.  Presently the patient is alert and pleasant and has no complaints. He denies neck pain, further seizures, numbness, tingling, etc. He does not take any anticoagulants.  Past Medical History  Diagnosis Date  . Seizures     History reviewed. No pertinent past surgical history.  Family History  Problem Relation Age of Onset  . Cancer Maternal Grandmother     ovarian cancer     Social History:  reports that he has been smoking Cigarettes.  He has been smoking about 1.00 pack per day. He has never used smokeless tobacco. He reports that he drinks alcohol. He reports that he uses illicit drugs (Marijuana).  Allergies: No Known Allergies  Medications:  I have reviewed the patient's current medications. Prior to Admission:  Prescriptions prior to admission  Medication Sig Dispense Refill  . zonisamide (ZONEGRAN) 50 MG capsule Take 100 mg by mouth daily. TAKE ONE CAPSULE DAILY FOR ONE WEEK, THEN INCREASE TO TWO CAPSULES DAILY       Scheduled: . enoxaparin (LOVENOX) injection  40 mg Subcutaneous Q24H  . nicotine  21 mg Transdermal Daily  . sodium chloride  3 mL Intravenous Q12H  . zonisamide  200 mg Oral Daily   Continuous: . sodium chloride 125 mL/hr (01/02/14 2017)   OYD:XAJOINOMVEHMC, acetaminophen, albuterol, HYDROcodone-acetaminophen, ipratropium, LORazepam, magnesium citrate, ondansetron (ZOFRAN) IV, ondansetron, polyethylene glycol,  sorbitol Anti-infectives   None       Results for orders placed during the hospital encounter of 01/02/14 (from the past 48 hour(s))  CBG MONITORING, ED     Status: Abnormal   Collection Time    01/02/14  3:19 PM      Result Value Ref Range   Glucose-Capillary 163 (*) 70 - 99 mg/dL  CBC WITH DIFFERENTIAL     Status: Abnormal   Collection Time    01/02/14  3:28 PM      Result Value Ref Range   WBC 10.0  4.0 - 10.5 K/uL   RBC 5.07  4.22 - 5.81 MIL/uL   Hemoglobin 16.1  13.0 - 17.0 g/dL   HCT 46.6  39.0 - 52.0 %   MCV 91.9  78.0 - 100.0 fL   MCH 31.8  26.0 - 34.0 pg   MCHC 34.5  30.0 - 36.0 g/dL   RDW 12.8  11.5 - 15.5 %   Platelets 198  150 - 400 K/uL   Neutrophils Relative % 84 (*) 43 - 77 %   Neutro Abs 8.4 (*) 1.7 - 7.7 K/uL   Lymphocytes Relative 12  12 - 46 %   Lymphs Abs 1.2  0.7 - 4.0 K/uL   Monocytes Relative 4  3 - 12 %   Monocytes Absolute 0.4  0.1 - 1.0 K/uL   Eosinophils Relative 0  0 - 5 %   Eosinophils Absolute 0.0  0.0 - 0.7 K/uL   Basophils Relative 0  0 - 1 %   Basophils Absolute 0.0  0.0 - 0.1 K/uL  BASIC METABOLIC PANEL  Status: Abnormal   Collection Time    01/02/14  3:28 PM      Result Value Ref Range   Sodium 136 (*) 137 - 147 mEq/L   Potassium 3.7  3.7 - 5.3 mEq/L   Chloride 97  96 - 112 mEq/L   CO2 16 (*) 19 - 32 mEq/L   Glucose, Bld 174 (*) 70 - 99 mg/dL   BUN 13  6 - 23 mg/dL   Creatinine, Ser 1.12  0.50 - 1.35 mg/dL   Calcium 9.2  8.4 - 10.5 mg/dL   GFR calc non Af Amer 82 (*) >90 mL/min   GFR calc Af Amer >90  >90 mL/min   Comment: (NOTE)     The eGFR has been calculated using the CKD EPI equation.     This calculation has not been validated in all clinical situations.     eGFR's persistently <90 mL/min signify possible Chronic Kidney     Disease.   Anion gap 23 (*) 5 - 15   Comment: REPEATED TO VERIFY  MAGNESIUM     Status: Abnormal   Collection Time    01/02/14  3:28 PM      Result Value Ref Range   Magnesium 3.0 (*) 1.5 -  2.5 mg/dL  CARBAMAZEPINE LEVEL, TOTAL     Status: Abnormal   Collection Time    01/02/14  3:30 PM      Result Value Ref Range   Carbamazepine Lvl <0.5 (*) 4.0 - 12.0 ug/mL   Comment: PERFORMED AT [facility]     Performed at [facility] (HOSP PERFORMED)     Status: Abnormal   Collection Time    01/02/14  3:46 PM      Result Value Ref Range   Opiates NONE DETECTED  NONE DETECTED   Cocaine NONE DETECTED  NONE DETECTED   Benzodiazepines POSITIVE (*) NONE DETECTED   Amphetamines NONE DETECTED  NONE DETECTED   Tetrahydrocannabinol POSITIVE (*) NONE DETECTED   Barbiturates NONE DETECTED  NONE DETECTED   Comment:            DRUG SCREEN FOR MEDICAL PURPOSES     ONLY.  IF CONFIRMATION IS NEEDED     FOR ANY PURPOSE, NOTIFY LAB     WITHIN 5 DAYS.                LOWEST DETECTABLE LIMITS     FOR URINE DRUG SCREEN     Drug Class       Cutoff (ng/mL)     Amphetamine      1000     Barbiturate      200     Benzodiazepine   492     Tricyclics       010     Opiates          300     Cocaine          300     THC              50  URINALYSIS, ROUTINE W REFLEX MICROSCOPIC     Status: Abnormal   Collection Time    01/02/14  3:46 PM      Result Value Ref Range   Color, Urine YELLOW  YELLOW   APPearance CLOUDY (*) CLEAR   Specific Gravity, Urine 1.017  1.005 - 1.030   pH 5.0  5.0 - 8.0   Glucose, UA NEGATIVE  NEGATIVE mg/dL   Hgb urine dipstick MODERATE (*) NEGATIVE   Bilirubin Urine NEGATIVE  NEGATIVE   Ketones, ur NEGATIVE  NEGATIVE mg/dL   Protein, ur 100 (*) NEGATIVE mg/dL   Urobilinogen, UA 0.2  0.0 - 1.0 mg/dL   Nitrite NEGATIVE  NEGATIVE   Leukocytes, UA NEGATIVE  NEGATIVE  URINE MICROSCOPIC-ADD ON     Status: Abnormal   Collection Time    01/02/14  3:46 PM      Result Value Ref Range   WBC, UA 0-2  <3 WBC/hpf   RBC / HPF 0-2  <3 RBC/hpf   Bacteria, UA FEW (*) RARE   Casts GRANULAR CAST (*) NEGATIVE   Comment: HYALINE CASTS   Urine-Other  AMORPHOUS URATES/PHOSPHATES    CBC     Status: Abnormal   Collection Time    01/03/14  3:30 AM      Result Value Ref Range   WBC 12.8 (*) 4.0 - 10.5 K/uL   RBC 4.44  4.22 - 5.81 MIL/uL   Hemoglobin 13.9  13.0 - 17.0 g/dL   HCT 40.6  39.0 - 52.0 %   MCV 91.4  78.0 - 100.0 fL   MCH 31.3  26.0 - 34.0 pg   MCHC 34.2  30.0 - 36.0 g/dL   RDW 12.9  11.5 - 15.5 %   Platelets 180  150 - 400 K/uL  CREATININE, SERUM     Status: Abnormal   Collection Time    01/03/14  3:30 AM      Result Value Ref Range   Creatinine, Ser 1.27  0.50 - 1.35 mg/dL   GFR calc non Af Amer 71 (*) >90 mL/min   GFR calc Af Amer 82 (*) >90 mL/min   Comment: (NOTE)     The eGFR has been calculated using the CKD EPI equation.     This calculation has not been validated in all clinical situations.     eGFR's persistently <90 mL/min signify possible Chronic Kidney     Disease.  PHOSPHORUS     Status: None   Collection Time    01/03/14  3:30 AM      Result Value Ref Range   Phosphorus 3.2  2.3 - 4.6 mg/dL  TSH     Status: None   Collection Time    01/03/14  3:30 AM      Result Value Ref Range   TSH 0.858  0.350 - 4.500 uIU/mL  COMPREHENSIVE METABOLIC PANEL     Status: Abnormal   Collection Time    01/03/14  3:30 AM      Result Value Ref Range   Sodium 141  137 - 147 mEq/L   Potassium 3.9  3.7 - 5.3 mEq/L   Chloride 106  96 - 112 mEq/L   CO2 20  19 - 32 mEq/L   Glucose, Bld 88  70 - 99 mg/dL   BUN 11  6 - 23 mg/dL   Creatinine, Ser 1.23  0.50 - 1.35 mg/dL   Calcium 8.6  8.4 - 10.5 mg/dL   Total Protein 6.5  6.0 - 8.3 g/dL   Albumin 3.3 (*) 3.5 - 5.2 g/dL   AST 51 (*) 0 - 37 U/L   ALT 23  0 - 53 U/L   Alkaline Phosphatase 70  39 - 117 U/L   Total Bilirubin 0.5  0.3 - 1.2 mg/dL   GFR calc non Af Amer 74 (*) >90 mL/min   GFR calc Af Wyvonnia Lora  85 (*) >90 mL/min   Comment: (NOTE)     The eGFR has been calculated using the CKD EPI equation.     This calculation has not been validated in all clinical situations.      eGFR's persistently <90 mL/min signify possible Chronic Kidney     Disease.   Anion gap 15  5 - 15  CBC     Status: Abnormal   Collection Time    01/03/14  3:30 AM      Result Value Ref Range   WBC 12.6 (*) 4.0 - 10.5 K/uL   RBC 4.50  4.22 - 5.81 MIL/uL   Hemoglobin 13.9  13.0 - 17.0 g/dL   HCT 41.2  39.0 - 52.0 %   MCV 91.6  78.0 - 100.0 fL   MCH 30.9  26.0 - 34.0 pg   MCHC 33.7  30.0 - 36.0 g/dL   RDW 12.9  11.5 - 15.5 %   Platelets 182  150 - 400 K/uL  LACTIC ACID, PLASMA     Status: None   Collection Time    01/03/14  3:30 AM      Result Value Ref Range   Lactic Acid, Venous 0.6  0.5 - 2.2 mmol/L  GLUCOSE, CAPILLARY     Status: None   Collection Time    01/03/14  7:52 AM      Result Value Ref Range   Glucose-Capillary 90  70 - 99 mg/dL    Dg Chest 2 View  01/02/2014   CLINICAL DATA:  Status post fall backwards, hitting his head. Grand mal seizure.  EXAM: CHEST  2 VIEW  COMPARISON:  None.  FINDINGS: The heart size and mediastinal contours are within normal limits. There is no focal infiltrate, pulmonary edema, or pleural effusion. There are chronic changes of the proximal left humerus. No acute fracture or dislocation is seen.  IMPRESSION: No active cardiopulmonary disease.   Electronically Signed   By: Abelardo Diesel M.D.   On: 01/02/2014 16:50   Ct Head Wo Contrast  01/02/2014   CLINICAL DATA:  Seizures, head injury  EXAM: CT HEAD WITHOUT CONTRAST  CT CERVICAL SPINE WITHOUT CONTRAST  TECHNIQUE: Multidetector CT imaging of the head and cervical spine was performed following the standard protocol without intravenous contrast. Multiplanar CT image reconstructions of the cervical spine were also generated.  COMPARISON:  None.  FINDINGS: CT HEAD FINDINGS  No skull fracture is noted. There is scalp swelling and subcutaneous hematoma in right posterior parietal scalp. The hematoma measures 3.8 cm length by 8 mm thickness.  No mass effect or midline shift. In axial image 10 and 11  there is tiny amount of subarachnoid hemorrhage in right sylvian fissure. No intraventricular hemorrhage.  CT CERVICAL SPINE FINDINGS  Axial images of the cervical spine shows no acute fracture or subluxation. Computer processed images shows no acute fracture or subluxation. Alignment, disc spaces and vertebral heights are preserved. No prevertebral soft tissue swelling. Cervical airway is patent.  IMPRESSION: 1. There is no mass effect or midline shift. There is small amount of subarachnoid hemorrhage in right sylvian fissure without mass effect. No intraventricular hemorrhage. Although subarachnoid hemorrhage may be post traumatic in nature ruptured intracranial aneurysm cannot be entirely excluded. Follow-up examination should be based on clinical grounds. No skull fracture is noted. There is scalp swelling and small subcutaneous hematoma in right posterior parietal scalp. 2. No cervical spine acute fracture or subluxation. No prevertebral soft tissue swelling. These results were called  by telephone at the time of interpretation on 01/02/2014 at 4:50 pm to Dr. Britt Bottom , who verbally acknowledged these results.   Electronically Signed   By: Lahoma Crocker M.D.   On: 01/02/2014 16:51   Ct Cervical Spine Wo Contrast  01/02/2014   CLINICAL DATA:  Seizures, head injury  EXAM: CT HEAD WITHOUT CONTRAST  CT CERVICAL SPINE WITHOUT CONTRAST  TECHNIQUE: Multidetector CT imaging of the head and cervical spine was performed following the standard protocol without intravenous contrast. Multiplanar CT image reconstructions of the cervical spine were also generated.  COMPARISON:  None.  FINDINGS: CT HEAD FINDINGS  No skull fracture is noted. There is scalp swelling and subcutaneous hematoma in right posterior parietal scalp. The hematoma measures 3.8 cm length by 8 mm thickness.  No mass effect or midline shift. In axial image 10 and 11 there is tiny amount of subarachnoid hemorrhage in right sylvian fissure. No  intraventricular hemorrhage.  CT CERVICAL SPINE FINDINGS  Axial images of the cervical spine shows no acute fracture or subluxation. Computer processed images shows no acute fracture or subluxation. Alignment, disc spaces and vertebral heights are preserved. No prevertebral soft tissue swelling. Cervical airway is patent.  IMPRESSION: 1. There is no mass effect or midline shift. There is small amount of subarachnoid hemorrhage in right sylvian fissure without mass effect. No intraventricular hemorrhage. Although subarachnoid hemorrhage may be post traumatic in nature ruptured intracranial aneurysm cannot be entirely excluded. Follow-up examination should be based on clinical grounds. No skull fracture is noted. There is scalp swelling and small subcutaneous hematoma in right posterior parietal scalp. 2. No cervical spine acute fracture or subluxation. No prevertebral soft tissue swelling. These results were called by telephone at the time of interpretation on 01/02/2014 at 4:50 pm to Dr. Britt Bottom , who verbally acknowledged these results.   Electronically Signed   By: Lahoma Crocker M.D.   On: 01/02/2014 16:51    ROS: As above Blood pressure 109/67, pulse 95, temperature 98.3 F (36.8 C), temperature source Oral, resp. rate 18, height 6' 2" (1.88 m), weight 71.8 kg (158 lb 4.6 oz), SpO2 98.00%. Physical Exam  General: An alert and pleasant 38 year old black male in no apparent distress  HEENT: Normocephalic, his pupils are equal round reactive light, extraocular muscles are intact.  Neck: Supple, no deformities or tenderness, he has a normal range of motion.  thorax: Symmetric  Neurologic exam: The patient is alert and oriented x3. Glasgow Coma Scale 15. Cranial nerves II through XII were examined bilaterally and grossly normal. Vision and hearing are grossly normal bilaterally. The patient's motor strength is 5 over 5 in his bilateral biceps, triceps, quadriceps, gastrocnemius. Cerebellar  function was intact to rapid alternating movements of the upper tremors bilaterally.  I have reviewed the patient's head CT performed without contrast at [facility] yesterday. It demonstrates a minimal right traumatic subarachnoid hemorrhage.    Assessment/Plan: Traumatic right subarachnoid hemorrhage, seizure disorder: The patient is presently neurologically normal. He can be discharged to home from my point of view. He should followup with his neurologist in [place] regarding his seizures.he can followup with me on a when necessary basis.  JENKINS,JEFFREY D 01/03/2014, 10:22 AM

## 2014-01-03 NOTE — Consult Note (Signed)
NEURO HOSPITALIST CONSULT NOTE    Reason for Consult: seizures. Small SAH on CT brain   HPI:                                                                                                                                          Ray Hill is an 38 y.o. 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 He was initially evaluated at [facility] " Patient presents to the emergency room with a witnessed seizure noted at the [facility] where patient was noted to have a grand mal seizure for approximately a minute was subsequently stopped and then another one lasting a few seconds. Patient was noted to have fallen and struck his head from the seizure. Patient was reportedly combative and had received Haldol in the field of Versed in the field per EMS. When patient was brought to the emergency room he was postictal". He has been on zonisamide 100 mg daily which he takes religiously and doesn't cause side effects. No recent alcohol intake, use of illicit drugs, pain medications, or benzodiazepines. Denies recent fever or medical illness. Patient had a CT brain upon arrival to [facility] that revealed small amount of subarachnoid hemorrhage in right sylvian fissure without mass effect. No intraventricular hemorrhage. Neurosurgery was consulted in that regard and no acute intervention was recommended. Stated that hi last seizure was approximately ome month ago. At this moment, he denies HA, vertigo, double vision, focal weakness or numbness, slurred speech, language or vision impairment.   Past Medical History  Diagnosis Date  . Seizures     History reviewed. No pertinent past surgical history.  Family History  Problem Relation Age of Onset  . Cancer Maternal Grandmother     ovarian cancer     Social History:   reports that he has been smoking Cigarettes.  He has been smoking about 1.00 pack per day. He has never used smokeless tobacco. He reports that he drinks alcohol. He reports that he uses illicit drugs (Marijuana).  No Known Allergies  MEDICATIONS:  Scheduled: . enoxaparin (LOVENOX) injection  40 mg Subcutaneous Q24H  . hydrogen peroxide      . nicotine  21 mg Transdermal Daily  . sodium chloride  3 mL Intravenous Q12H     ROS:                                                                                                                                       History obtained from the patient, mother, and chart review  General ROS: negative for - chills, fatigue, fever, night sweats, weight gain or weight loss Psychological ROS: negative for - behavioral disorder, hallucinations, memory difficulties, mood swings or suicidal ideation Ophthalmic ROS: negative for - blurry vision, double vision, eye pain or loss of vision ENT ROS: negative for - epistaxis, nasal discharge, oral lesions, sore throat, tinnitus or vertigo Allergy and Immunology ROS: negative for - hives or itchy/watery eyes Hematological and Lymphatic ROS: negative for - bleeding problems, bruising or swollen lymph nodes Endocrine ROS: negative for - galactorrhea, hair pattern changes, polydipsia/polyuria or temperature intolerance Respiratory ROS: negative for - cough, hemoptysis, shortness of breath or wheezing Cardiovascular ROS: negative for - chest pain, dyspnea on exertion, edema or irregular heartbeat Gastrointestinal ROS: negative for - abdominal pain, diarrhea, hematemesis, nausea/vomiting or stool incontinence Genito-Urinary ROS: negative for - dysuria, hematuria, incontinence or urinary frequency/urgency Musculoskeletal ROS: negative for - joint swelling or muscular weakness Neurological ROS: as noted in  HPI Dermatological ROS: negative for rash and skin lesion changes   Physical exam: pleasant male in no apparent distress.Blood pressure 107/67, pulse 95, temperature 98.4 F (36.9 C), temperature source Oral, resp. rate 18, height '6\' 2"'  (1.88 m), weight 71.804 kg (158 lb 4.8 oz), SpO2 100.00%. Head: normocephalic. Neck: supple, no bruits, no JVD. Cardiac: no murmurs. Lungs: clear. Abdomen: soft, no tender, no mass. Extremities: no edema. CV: pulses palpable throughout  Neurologic Examination:                                                                                                      General: Mental Status: Alert, oriented, thought content appropriate.  Speech fluent without evidence of aphasia.  Able to follow 3 step commands without difficulty. Cranial Nerves: II: Discs flat bilaterally; Visual fields grossly normal, pupils equal, round, reactive to light and accommodation III,IV, VI: ptosis not present, extra-ocular motions intact bilaterally V,VII: smile symmetric, facial light touch sensation normal bilaterally VIII: hearing normal bilaterally IX,X: gag reflex present XI: bilateral shoulder shrug XII:  midline tongue extension without atrophy or fasciculations  Motor: Right : Upper extremity   5/5    Left:     Upper extremity   5/5  Lower extremity   5/5     Lower extremity   5/5 Tone and bulk:normal tone throughout; no atrophy noted Sensory: Pinprick and light touch intact throughout, bilaterally Deep Tendon Reflexes:  Right: Upper Extremity   Left: Upper extremity   biceps (C-5 to C-6) 2/4   biceps (C-5 to C-6) 2/4 tricep (C7) 2/4    triceps (C7) 2/4 Brachioradialis (C6) 2/4  Brachioradialis (C6) 2/4  Lower Extremity Lower Extremity  quadriceps (L-2 to L-4) 2/4   quadriceps (L-2 to L-4) 2/4 Achilles (S1) 2/4   Achilles (S1) 2/4  Plantars: Right: downgoing   Left: downgoing Cerebellar: normal finger-to-nose,  normal heel-to-shin test Gait:  No tested      No results found for this basename: cbc, bmp, coags, chol, tri, ldl, hga1c    Results for orders placed during the hospital encounter of 01/02/14 (from the past 48 hour(s))  CBG MONITORING, ED     Status: Abnormal   Collection Time    01/02/14  3:19 PM      Result Value Ref Range   Glucose-Capillary 163 (*) 70 - 99 mg/dL  CBC WITH DIFFERENTIAL     Status: Abnormal   Collection Time    01/02/14  3:28 PM      Result Value Ref Range   WBC 10.0  4.0 - 10.5 K/uL   RBC 5.07  4.22 - 5.81 MIL/uL   Hemoglobin 16.1  13.0 - 17.0 g/dL   HCT 46.6  39.0 - 52.0 %   MCV 91.9  78.0 - 100.0 fL   MCH 31.8  26.0 - 34.0 pg   MCHC 34.5  30.0 - 36.0 g/dL   RDW 12.8  11.5 - 15.5 %   Platelets 198  150 - 400 K/uL   Neutrophils Relative % 84 (*) 43 - 77 %   Neutro Abs 8.4 (*) 1.7 - 7.7 K/uL   Lymphocytes Relative 12  12 - 46 %   Lymphs Abs 1.2  0.7 - 4.0 K/uL   Monocytes Relative 4  3 - 12 %   Monocytes Absolute 0.4  0.1 - 1.0 K/uL   Eosinophils Relative 0  0 - 5 %   Eosinophils Absolute 0.0  0.0 - 0.7 K/uL   Basophils Relative 0  0 - 1 %   Basophils Absolute 0.0  0.0 - 0.1 K/uL  BASIC METABOLIC PANEL     Status: Abnormal   Collection Time    01/02/14  3:28 PM      Result Value Ref Range   Sodium 136 (*) 137 - 147 mEq/L   Potassium 3.7  3.7 - 5.3 mEq/L   Chloride 97  96 - 112 mEq/L   CO2 16 (*) 19 - 32 mEq/L   Glucose, Bld 174 (*) 70 - 99 mg/dL   BUN 13  6 - 23 mg/dL   Creatinine, Ser 1.12  0.50 - 1.35 mg/dL   Calcium 9.2  8.4 - 10.5 mg/dL   GFR calc non Af Amer 82 (*) >90 mL/min   GFR calc Af Amer >90  >90 mL/min   Comment: (NOTE)     The eGFR has been calculated using the CKD EPI equation.     This calculation has not been validated in all clinical situations.     eGFR's persistently <90 mL/min signify  possible Chronic Kidney     Disease.   Anion gap 23 (*) 5 - 15   Comment: REPEATED TO VERIFY  MAGNESIUM     Status: Abnormal   Collection Time    01/02/14  3:28 PM      Result  Value Ref Range   Magnesium 3.0 (*) 1.5 - 2.5 mg/dL  CARBAMAZEPINE LEVEL, TOTAL     Status: Abnormal   Collection Time    01/02/14  3:30 PM      Result Value Ref Range   Carbamazepine Lvl <0.5 (*) 4.0 - 12.0 ug/mL   Comment: PERFORMED AT [facility]     Performed at [place] (HOSP PERFORMED)     Status: Abnormal   Collection Time    01/02/14  3:46 PM      Result Value Ref Range   Opiates NONE DETECTED  NONE DETECTED   Cocaine NONE DETECTED  NONE DETECTED   Benzodiazepines POSITIVE (*) NONE DETECTED   Amphetamines NONE DETECTED  NONE DETECTED   Tetrahydrocannabinol POSITIVE (*) NONE DETECTED   Barbiturates NONE DETECTED  NONE DETECTED   Comment:            DRUG SCREEN FOR MEDICAL PURPOSES     ONLY.  IF CONFIRMATION IS NEEDED     FOR ANY PURPOSE, NOTIFY LAB     WITHIN 5 DAYS.                LOWEST DETECTABLE LIMITS     FOR URINE DRUG SCREEN     Drug Class       Cutoff (ng/mL)     Amphetamine      1000     Barbiturate      200     Benzodiazepine   038     Tricyclics       882     Opiates          300     Cocaine          300     THC              50  URINALYSIS, ROUTINE W REFLEX MICROSCOPIC     Status: Abnormal   Collection Time    01/02/14  3:46 PM      Result Value Ref Range   Color, Urine YELLOW  YELLOW   APPearance CLOUDY (*) CLEAR   Specific Gravity, Urine 1.017  1.005 - 1.030   pH 5.0  5.0 - 8.0   Glucose, UA NEGATIVE  NEGATIVE mg/dL   Hgb urine dipstick MODERATE (*) NEGATIVE   Bilirubin Urine NEGATIVE  NEGATIVE   Ketones, ur NEGATIVE  NEGATIVE mg/dL   Protein, ur 100 (*) NEGATIVE mg/dL   Urobilinogen, UA 0.2  0.0 - 1.0 mg/dL   Nitrite NEGATIVE  NEGATIVE   Leukocytes, UA NEGATIVE  NEGATIVE  URINE MICROSCOPIC-ADD ON     Status: Abnormal   Collection Time    01/02/14  3:46 PM      Result Value Ref Range   WBC, UA 0-2  <3 WBC/hpf   RBC / HPF 0-2  <3 RBC/hpf   Bacteria, UA FEW (*) RARE   Casts GRANULAR CAST (*)  NEGATIVE   Comment: HYALINE CASTS   Urine-Other AMORPHOUS URATES/PHOSPHATES      Dg Chest 2 View  01/02/2014   CLINICAL DATA:  Status post fall backwards, hitting his head. Grand mal seizure.  EXAM: CHEST  2 VIEW  COMPARISON:  None.  FINDINGS: The heart size and mediastinal contours are within normal limits. There is no focal infiltrate, pulmonary edema, or pleural effusion. There are chronic changes of the proximal left humerus. No acute fracture or dislocation is seen.  IMPRESSION: No active cardiopulmonary disease.   Electronically Signed   By: Abelardo Diesel M.D.   On: 01/02/2014 16:50   Ct Head Wo Contrast  01/02/2014   CLINICAL DATA:  Seizures, head injury  EXAM: CT HEAD WITHOUT CONTRAST  CT CERVICAL SPINE WITHOUT CONTRAST  TECHNIQUE: Multidetector CT imaging of the head and cervical spine was performed following the standard protocol without intravenous contrast. Multiplanar CT image reconstructions of the cervical spine were also generated.  COMPARISON:  None.  FINDINGS: CT HEAD FINDINGS  No skull fracture is noted. There is scalp swelling and subcutaneous hematoma in right posterior parietal scalp. The hematoma measures 3.8 cm length by 8 mm thickness.  No mass effect or midline shift. In axial image 10 and 11 there is tiny amount of subarachnoid hemorrhage in right sylvian fissure. No intraventricular hemorrhage.  CT CERVICAL SPINE FINDINGS  Axial images of the cervical spine shows no acute fracture or subluxation. Computer processed images shows no acute fracture or subluxation. Alignment, disc spaces and vertebral heights are preserved. No prevertebral soft tissue swelling. Cervical airway is patent.  IMPRESSION: 1. There is no mass effect or midline shift. There is small amount of subarachnoid hemorrhage in right sylvian fissure without mass effect. No intraventricular hemorrhage. Although subarachnoid hemorrhage may be post traumatic in nature ruptured intracranial aneurysm cannot be entirely  excluded. Follow-up examination should be based on clinical grounds. No skull fracture is noted. There is scalp swelling and small subcutaneous hematoma in right posterior parietal scalp. 2. No cervical spine acute fracture or subluxation. No prevertebral soft tissue swelling. These results were called by telephone at the time of interpretation on 01/02/2014 at 4:50 pm to Dr. Britt Bottom , who verbally acknowledged these results.   Electronically Signed   By: Lahoma Crocker M.D.   On: 01/02/2014 16:51   Ct Cervical Spine Wo Contrast  01/02/2014   CLINICAL DATA:  Seizures, head injury  EXAM: CT HEAD WITHOUT CONTRAST  CT CERVICAL SPINE WITHOUT CONTRAST  TECHNIQUE: Multidetector CT imaging of the head and cervical spine was performed following the standard protocol without intravenous contrast. Multiplanar CT image reconstructions of the cervical spine were also generated.  COMPARISON:  None.  FINDINGS: CT HEAD FINDINGS  No skull fracture is noted. There is scalp swelling and subcutaneous hematoma in right posterior parietal scalp. The hematoma measures 3.8 cm length by 8 mm thickness.  No mass effect or midline shift. In axial image 10 and 11 there is tiny amount of subarachnoid hemorrhage in right sylvian fissure. No intraventricular hemorrhage.  CT CERVICAL SPINE FINDINGS  Axial images of the cervical spine shows no acute fracture or subluxation. Computer processed images shows no acute fracture or subluxation. Alignment, disc spaces and vertebral heights are preserved. No prevertebral soft tissue swelling. Cervical airway is patent.  IMPRESSION: 1. There is no mass effect or midline shift. There is small amount of subarachnoid hemorrhage in right sylvian fissure without mass effect. No intraventricular hemorrhage. Although subarachnoid hemorrhage may be post traumatic in nature ruptured intracranial aneurysm cannot be entirely excluded. Follow-up examination should be based on clinical grounds. No skull  fracture is noted. There is scalp swelling and small subcutaneous hematoma in right posterior parietal scalp. 2. No  cervical spine acute fracture or subluxation. No prevertebral soft tissue swelling. These results were called by telephone at the time of interpretation on 01/02/2014 at 4:50 pm to Dr. Britt Bottom , who verbally acknowledged these results.   Electronically Signed   By: Lahoma Crocker M.D.   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 Currently asymptomatic from a neuro standpoint, non focal neuro-exam. Recommend: 1) Increase zonisamide to 200 mg daily. 2) Follow up CT brain in am. Will follow up.  Dorian Pod, MD 01/03/2014, 3:02 AM

## 2014-01-03 NOTE — Progress Notes (Signed)
Patient ID: Ray Hill  male  ZOX:[id number]    DOB: January 19, 1976    DOA: 01/02/2014  PCP: Jeanann Lewandowsky, MD  Assessment/Plan: Principal Problem:   SAH (subarachnoid hemorrhage) - Currently stable, repeat CT head today - Continue serial neurochecks  Active Problems:   History of focal epilepsy with seizures - Increased zonisamide to 200 mg daily - Patient follows [facility] neurology, per patient's mother, having seizures every fourth or fifth week. -Neurology following, we'll follow recommendations     Smoking - Patient counseled on smoking cessation, continue nicotine patch    Acidosis, metabolic -Resolved likely due to Dehydration and seizures   DVT Prophylaxis:  Code Status:Full code   Family Communication:Discussed in detail with patient's mother at the bedside   Disposition:Likely in a.m.   Consultants:  Neurology   Procedures: CT head 10/10 IMPRESSION:  1. There is no mass effect or midline shift. There is small amount  of subarachnoid hemorrhage in right sylvian fissure without mass  effect. No intraventricular hemorrhage. Although subarachnoid  hemorrhage may be post traumatic in nature ruptured intracranial  aneurysm cannot be entirely excluded. Follow-up examination should  be based on clinical grounds. No skull fracture is noted. There is  scalp swelling and small subcutaneous hematoma in right posterior  parietal scalp.    Antibiotics:  None    Subjective: Patient seen and examined, no complaints no acute issues overnight   Objective: Weight change:  No intake or output data in the 24 hours ending 01/03/14 0936 Blood pressure 109/67, pulse 95, temperature 98.3 F (36.8 C), temperature source Oral, resp. rate 18, height 6\' 2"  (1.88 m), weight 71.8 kg (158 lb 4.6 oz), SpO2 98.00%.  Physical Exam: General: Alert and awake, oriented x3, not in any acute distress. CVS: S1-S2 clear, no murmur rubs or gallops Chest: clear to auscultation  bilaterally Abdomen: soft nontender, nondistended, normal bowel sounds  Extremities: no cyanosis, clubbing or edema noted bilaterally Neuro: Cranial nerves II-XII intact, no focal neurological deficits  Lab Results: Basic Metabolic Panel:  Recent Labs Lab 01/02/14 1528 01/03/14 0330  NA 136* 141  K 3.7 3.9  CL 97 106  CO2 16* 20  GLUCOSE 174* 88  BUN 13 11  CREATININE 1.12 1.27  1.23  CALCIUM 9.2 8.6  MG 3.0*  --   PHOS  --  3.2   Liver Function Tests:  Recent Labs Lab 01/03/14 0330  AST 51*  ALT 23  ALKPHOS 70  BILITOT 0.5  PROT 6.5  ALBUMIN 3.3*   No results found for this basename: LIPASE, AMYLASE,  in the last 168 hours No results found for this basename: AMMONIA,  in the last 168 hours CBC:  Recent Labs Lab 01/02/14 1528 01/03/14 0330  WBC 10.0 12.8*  12.6*  NEUTROABS 8.4*  --   HGB 16.1 13.9  13.9  HCT 46.6 40.6  41.2  MCV 91.9 91.4  91.6  PLT 198 180  182   Cardiac Enzymes: No results found for this basename: CKTOTAL, CKMB, CKMBINDEX, TROPONINI,  in the last 168 hours BNP: No components found with this basename: POCBNP,  CBG:  Recent Labs Lab 01/02/14 1519 01/03/14 0752  GLUCAP 163* 90     Micro Results: No results found for this or any previous visit (from the past 240 hour(s)).  Studies/Results: Dg Chest 2 View  01/02/2014   CLINICAL DATA:  Status post fall backwards, hitting his head. Grand mal seizure.  EXAM: CHEST  2 VIEW  COMPARISON:  None.  FINDINGS: The heart size and mediastinal contours are within normal limits. There is no focal infiltrate, pulmonary edema, or pleural effusion. There are chronic changes of the proximal left humerus. No acute fracture or dislocation is seen.  IMPRESSION: No active cardiopulmonary disease.   Electronically Signed   By: Sherian ReinWei-Chen  Lin M.D.   On: 01/02/2014 16:50   Ct Head Wo Contrast  01/02/2014   CLINICAL DATA:  Seizures, head injury  EXAM: CT HEAD WITHOUT CONTRAST  CT CERVICAL SPINE  WITHOUT CONTRAST  TECHNIQUE: Multidetector CT imaging of the head and cervical spine was performed following the standard protocol without intravenous contrast. Multiplanar CT image reconstructions of the cervical spine were also generated.  COMPARISON:  None.  FINDINGS: CT HEAD FINDINGS  No skull fracture is noted. There is scalp swelling and subcutaneous hematoma in right posterior parietal scalp. The hematoma measures 3.8 cm length by 8 mm thickness.  No mass effect or midline shift. In axial image 10 and 11 there is tiny amount of subarachnoid hemorrhage in right sylvian fissure. No intraventricular hemorrhage.  CT CERVICAL SPINE FINDINGS  Axial images of the cervical spine shows no acute fracture or subluxation. Computer processed images shows no acute fracture or subluxation. Alignment, disc spaces and vertebral heights are preserved. No prevertebral soft tissue swelling. Cervical airway is patent.  IMPRESSION: 1. There is no mass effect or midline shift. There is small amount of subarachnoid hemorrhage in right sylvian fissure without mass effect. No intraventricular hemorrhage. Although subarachnoid hemorrhage may be post traumatic in nature ruptured intracranial aneurysm cannot be entirely excluded. Follow-up examination should be based on clinical grounds. No skull fracture is noted. There is scalp swelling and small subcutaneous hematoma in right posterior parietal scalp. 2. No cervical spine acute fracture or subluxation. No prevertebral soft tissue swelling. These results were called by telephone at the time of interpretation on 01/02/2014 at 4:50 pm to Dr. Harle BattiestELIZABETH TYSINGER , who verbally acknowledged these results.   Electronically Signed   By: Natasha MeadLiviu  Pop M.D.   On: 01/02/2014 16:51   Ct Cervical Spine Wo Contrast  01/02/2014   CLINICAL DATA:  Seizures, head injury  EXAM: CT HEAD WITHOUT CONTRAST  CT CERVICAL SPINE WITHOUT CONTRAST  TECHNIQUE: Multidetector CT imaging of the head and cervical  spine was performed following the standard protocol without intravenous contrast. Multiplanar CT image reconstructions of the cervical spine were also generated.  COMPARISON:  None.  FINDINGS: CT HEAD FINDINGS  No skull fracture is noted. There is scalp swelling and subcutaneous hematoma in right posterior parietal scalp. The hematoma measures 3.8 cm length by 8 mm thickness.  No mass effect or midline shift. In axial image 10 and 11 there is tiny amount of subarachnoid hemorrhage in right sylvian fissure. No intraventricular hemorrhage.  CT CERVICAL SPINE FINDINGS  Axial images of the cervical spine shows no acute fracture or subluxation. Computer processed images shows no acute fracture or subluxation. Alignment, disc spaces and vertebral heights are preserved. No prevertebral soft tissue swelling. Cervical airway is patent.  IMPRESSION: 1. There is no mass effect or midline shift. There is small amount of subarachnoid hemorrhage in right sylvian fissure without mass effect. No intraventricular hemorrhage. Although subarachnoid hemorrhage may be post traumatic in nature ruptured intracranial aneurysm cannot be entirely excluded. Follow-up examination should be based on clinical grounds. No skull fracture is noted. There is scalp swelling and small subcutaneous hematoma in right posterior parietal scalp. 2. No cervical spine acute fracture or  subluxation. No prevertebral soft tissue swelling. These results were called by telephone at the time of interpretation on 01/02/2014 at 4:50 pm to Dr. Harle BattiestELIZABETH TYSINGER , who verbally acknowledged these results.   Electronically Signed   By: Natasha MeadLiviu  Pop M.D.   On: 01/02/2014 16:51    Medications: Scheduled Meds: . enoxaparin (LOVENOX) injection  40 mg Subcutaneous Q24H  . nicotine  21 mg Transdermal Daily  . sodium chloride  3 mL Intravenous Q12H  . zonisamide  200 mg Oral Daily      LOS: 1 day   RAI,RIPUDEEP M.D. [facility] 01/03/2014, 9:36 AM Pager:  [phone]  If 7PM-7AM, please contact night-coverage [email or web address] Password [email or web address]

## 2014-01-04 DIAGNOSIS — R4182 Altered mental status, unspecified: Secondary | ICD-10-CM

## 2014-01-04 DIAGNOSIS — F1210 Cannabis abuse, uncomplicated: Secondary | ICD-10-CM

## 2014-01-04 DIAGNOSIS — R569 Unspecified convulsions: Secondary | ICD-10-CM

## 2014-01-04 LAB — URINE CULTURE
Colony Count: NO GROWTH
Culture: NO GROWTH

## 2014-01-04 LAB — GLUCOSE, CAPILLARY: Glucose-Capillary: 91 mg/dL (ref 70–99)

## 2014-01-04 NOTE — Progress Notes (Signed)
Subjective: Per family patient had on e further seizure lasting for about 2  minutes and consisted of AMS, picking at his clothing. HE is currently back at his baseline and having no further seizure activity.   Objective: Current vital signs: BP 117/79  Pulse 86  Temp(Src) 98.5 F (36.9 C) (Oral)  Resp 16  Ht 6\' 2"  (1.88 m)  Wt 72.122 kg (159 lb)  BMI 20.41 kg/m2  SpO2 100% Vital signs in last 24 hours: Temp:  [97.9 F (36.6 C)-98.7 F (37.1 C)] 98.5 F (36.9 C) (10/12 0627) Pulse Rate:  [69-94] 86 (10/12 0810) Resp:  [16-18] 16 (10/12 0810) BP: (104-129)/(65-79) 117/79 mmHg (10/12 0810) SpO2:  [98 %-100 %] 100 % (10/12 0810) Weight:  [71.986 kg (158 lb 11.2 oz)-72.122 kg (159 lb)] 72.122 kg (159 lb) (10/12 0734)  Intake/Output from previous day: 10/11 0701 - 10/12 0700 In: -  Out: 325 [Urine:325] Intake/Output this shift:   Nutritional status: General  Neurologic Exam: General: NAD Mental Status: Alert, oriented, thought content appropriate.  Speech fluent without evidence of aphasia.  Able to follow 3 step commands without difficulty. Cranial Nerves: II: Discs flat bilaterally; Visual fields grossly normal, pupils equal, round, reactive to light and accommodation III,IV, VI: ptosis not present, extra-ocular motions intact bilaterally V,VII: smile symmetric, facial light touch sensation normal bilaterally VIII: hearing normal bilaterally IX,X: gag reflex present XI: bilateral shoulder shrug XII: midline tongue extension without atrophy or fasciculations  Motor: Right : Upper extremity   5/5    Left:     Upper extremity   5/5  Lower extremity   5/5     Lower extremity   5/5 Tone and bulk:normal tone throughout; no atrophy noted Sensory: Pinprick and light touch intact throughout, bilaterally Deep Tendon Reflexes:  Right: Upper Extremity   Left: Upper extremity   biceps (C-5 to C-6) 2/4   biceps (C-5 to C-6) 2/4 tricep (C7) 2/4    triceps (C7)  2/4 Brachioradialis (C6) 2/4  Brachioradialis (C6) 2/4  Lower Extremity Lower Extremity  quadriceps (L-2 to L-4) 2/4   quadriceps (L-2 to L-4) 2/4 Achilles (S1) 2/4   Achilles (S1) 2/4  Plantars: Right: downgoing   Left: downgoing Cerebellar: normal finger-to-nose,  normal heel-to-shin test    Lab Results: Basic Metabolic Panel:  Recent Labs Lab 01/02/14 1528 01/03/14 0330  NA 136* 141  K 3.7 3.9  CL 97 106  CO2 16* 20  GLUCOSE 174* 88  BUN 13 11  CREATININE 1.12 1.27  1.23  CALCIUM 9.2 8.6  MG 3.0*  --   PHOS  --  3.2    Liver Function Tests:  Recent Labs Lab 01/03/14 0330  AST 51*  ALT 23  ALKPHOS 70  BILITOT 0.5  PROT 6.5  ALBUMIN 3.3*   No results found for this basename: LIPASE, AMYLASE,  in the last 168 hours No results found for this basename: AMMONIA,  in the last 168 hours  CBC:  Recent Labs Lab 01/02/14 1528 01/03/14 0330  WBC 10.0 12.8*  12.6*  NEUTROABS 8.4*  --   HGB 16.1 13.9  13.9  HCT 46.6 40.6  41.2  MCV 91.9 91.4  91.6  PLT 198 180  182    Cardiac Enzymes: No results found for this basename: CKTOTAL, CKMB, CKMBINDEX, TROPONINI,  in the last 168 hours  Lipid Panel: No results found for this basename: CHOL, TRIG, HDL, CHOLHDL, VLDL, LDLCALC,  in the last 168 hours  CBG:  Recent  Labs Lab 01/02/14 1519 01/03/14 0752  GLUCAP 163* 90    Microbiology: Results for orders placed during the hospital encounter of 12/31/07  URINE CULTURE     Status: None   Collection Time    12/31/07 10:17 AM      Result Value Ref Range Status   Specimen Description URINE, CLEAN CATCH   Final   Special Requests NONE   Final   Colony Count NO GROWTH   Final   Culture NO GROWTH   Final   Report Status 01/01/2008 FINAL   Final    Coagulation Studies: No results found for this basename: LABPROT, INR,  in the last 72 hours  Imaging: Dg Chest 2 View  01/02/2014   CLINICAL DATA:  Status post fall backwards, hitting his head. Grand  mal seizure.  EXAM: CHEST  2 VIEW  COMPARISON:  None.  FINDINGS: The heart size and mediastinal contours are within normal limits. There is no focal infiltrate, pulmonary edema, or pleural effusion. There are chronic changes of the proximal left humerus. No acute fracture or dislocation is seen.  IMPRESSION: No active cardiopulmonary disease.   Electronically Signed   By: Sherian ReinWei-Chen  Lin M.D.   On: 01/02/2014 16:50   Ct Head Wo Contrast  01/03/2014   CLINICAL DATA:  Follow-up subarachnoid hemorrhage diagnosis yesterday.  EXAM: CT HEAD WITHOUT CONTRAST  TECHNIQUE: Contiguous axial images were obtained from the base of the skull through the vertex without intravenous contrast.  COMPARISON:  January 02, 2014  FINDINGS: The previously described right sylvian fissure subarachnoid hemorrhage is much less well seen today. No new focus of acute hemorrhage is noted. There is no midline shift, hydrocephalus, or mass. No transcortical infarct is identified. There is right parietal scalp swelling. The bony calvarium is intact. Air-fluid levels are identified within the left maxillary sinus unchanged.  IMPRESSION: The previously described right sylvian fissure subarachnoid hemorrhage is less well seen today. No new focus of acute hemorrhage is noted.   Electronically Signed   By: Sherian ReinWei-Chen  Lin M.D.   On: 01/03/2014 16:16   Ct Head Wo Contrast  01/02/2014   CLINICAL DATA:  Seizures, head injury  EXAM: CT HEAD WITHOUT CONTRAST  CT CERVICAL SPINE WITHOUT CONTRAST  TECHNIQUE: Multidetector CT imaging of the head and cervical spine was performed following the standard protocol without intravenous contrast. Multiplanar CT image reconstructions of the cervical spine were also generated.  COMPARISON:  None.  FINDINGS: CT HEAD FINDINGS  No skull fracture is noted. There is scalp swelling and subcutaneous hematoma in right posterior parietal scalp. The hematoma measures 3.8 cm length by 8 mm thickness.  No mass effect or midline  shift. In axial image 10 and 11 there is tiny amount of subarachnoid hemorrhage in right sylvian fissure. No intraventricular hemorrhage.  CT CERVICAL SPINE FINDINGS  Axial images of the cervical spine shows no acute fracture or subluxation. Computer processed images shows no acute fracture or subluxation. Alignment, disc spaces and vertebral heights are preserved. No prevertebral soft tissue swelling. Cervical airway is patent.  IMPRESSION: 1. There is no mass effect or midline shift. There is small amount of subarachnoid hemorrhage in right sylvian fissure without mass effect. No intraventricular hemorrhage. Although subarachnoid hemorrhage may be post traumatic in nature ruptured intracranial aneurysm cannot be entirely excluded. Follow-up examination should be based on clinical grounds. No skull fracture is noted. There is scalp swelling and small subcutaneous hematoma in right posterior parietal scalp. 2. No cervical spine acute fracture  or subluxation. No prevertebral soft tissue swelling. These results were called by telephone at the time of interpretation on 01/02/2014 at 4:50 pm to Dr. Harle Battiest , who verbally acknowledged these results.   Electronically Signed   By: Natasha Mead M.D.   On: 01/02/2014 16:51   Ct Cervical Spine Wo Contrast  01/02/2014   CLINICAL DATA:  Seizures, head injury  EXAM: CT HEAD WITHOUT CONTRAST  CT CERVICAL SPINE WITHOUT CONTRAST  TECHNIQUE: Multidetector CT imaging of the head and cervical spine was performed following the standard protocol without intravenous contrast. Multiplanar CT image reconstructions of the cervical spine were also generated.  COMPARISON:  None.  FINDINGS: CT HEAD FINDINGS  No skull fracture is noted. There is scalp swelling and subcutaneous hematoma in right posterior parietal scalp. The hematoma measures 3.8 cm length by 8 mm thickness.  No mass effect or midline shift. In axial image 10 and 11 there is tiny amount of subarachnoid hemorrhage in  right sylvian fissure. No intraventricular hemorrhage.  CT CERVICAL SPINE FINDINGS  Axial images of the cervical spine shows no acute fracture or subluxation. Computer processed images shows no acute fracture or subluxation. Alignment, disc spaces and vertebral heights are preserved. No prevertebral soft tissue swelling. Cervical airway is patent.  IMPRESSION: 1. There is no mass effect or midline shift. There is small amount of subarachnoid hemorrhage in right sylvian fissure without mass effect. No intraventricular hemorrhage. Although subarachnoid hemorrhage may be post traumatic in nature ruptured intracranial aneurysm cannot be entirely excluded. Follow-up examination should be based on clinical grounds. No skull fracture is noted. There is scalp swelling and small subcutaneous hematoma in right posterior parietal scalp. 2. No cervical spine acute fracture or subluxation. No prevertebral soft tissue swelling. These results were called by telephone at the time of interpretation on 01/02/2014 at 4:50 pm to Dr. Harle Battiest , who verbally acknowledged these results.   Electronically Signed   By: Natasha Mead M.D.   On: 01/02/2014 16:51    Medications:  Scheduled: . enoxaparin (LOVENOX) injection  40 mg Subcutaneous Q24H  . nicotine  21 mg Transdermal Daily  . sodium chloride  3 mL Intravenous Q12H  . 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 Zonisamide was increased increased in dose to 200 mg Daily yesterday and patient had received one dose prior to today's seizure. Would like to give more time for AED to reach steady state before adjusting medications. Currently asymptomatic.  Recommend: 1) Continue seizure precautions 2) Frequent neuro checks 3) Continue current dose of Zonisamide 4) Nurses to call neurology with any further seizure activity      Felicie Morn PA-C [facility] [phone]  01/04/2014, 9:19 AM

## 2014-01-04 NOTE — Progress Notes (Signed)
UR complete.  Aldo Sondgeroth RN, MSN 

## 2014-01-04 NOTE — Progress Notes (Signed)
Patient ID: Ray Hill  male  ZOX:[id number]    DOB: Sep 10, 1975    DOA: 01/02/2014  PCP: Jeanann Lewandowsky, MD  Brief history of present illness  The patient is a 38 year old male with history of seizures followed by neurology in [place] presented with seizure, fell and struck his head. CT showed minimal right traumatic SAH. Repeat CT head stable. Patient was cleared by neurosurgery, Dr. Lovell Sheehan. Neurology was also consulted for seizures.  Assessment/Plan: Principal Problem:   SAH (subarachnoid hemorrhage) - Stable, repeat CT head on 10/11 showed stable and less SAH - Continue serial neurochecks -Patient cleared by neurosurgery, Dr. Lovell Sheehan   Active Problems:   History of focal epilepsy with seizures -  Patient had another repeat seizure episode today around 8 AM. Patient had received increased zonisamide 200 mg  yesterday. Patient follows [facility] neurology, per patient's mother, having seizures every fourth or fifth week. - Neurology following, discussed with Dr. Thad Ranger, Will follow recommendations, patient's mother requesting for neurologist in [facility]    Smoking - Patient counseled on smoking cessation, continue nicotine patch    Acidosis, metabolic -Resolved likely due to Dehydration and seizures recheck BMET   DVT Prophylaxis:  Code Status:Full code   Family Communication: Discussed in detail with patient's mother and sister at the bedside   Disposition:   Consultants:  Neurology   Neurosurgery  Procedures: CT head 10/10 IMPRESSION:  1. There is no mass effect or midline shift. There is small amount  of subarachnoid hemorrhage in right sylvian fissure without mass  effect. No intraventricular hemorrhage. Although subarachnoid  hemorrhage may be post traumatic in nature ruptured intracranial  aneurysm cannot be entirely excluded. Follow-up examination should  be based on clinical grounds. No skull fracture is noted. There is  scalp swelling and  small subcutaneous hematoma in right posterior  parietal scalp.  CT head 10/11 IMPRESSION:  The previously described right sylvian fissure subarachnoid  hemorrhage is less well seen today. No new focus of acute hemorrhage  is noted.   Antibiotics:  None    Subjective:  seizure episode around 8 AM this morning lasting 5 minutes, still in postictal phase at the time of my evaluation, mother and sister at the bedside  Objective: Weight change: 0.181 kg (6.4 oz)  Intake/Output Summary (Last 24 hours) at 01/04/14 1025 Last data filed at 01/04/14 0251  Gross per 24 hour  Intake      0 ml  Output    325 ml  Net   -325 ml   Blood pressure 117/84, pulse 62, temperature 98.7 F (37.1 C), temperature source Oral, resp. rate 20, height 6\' 2"  (1.88 m), weight 72.122 kg (159 lb), SpO2 100.00%.  Physical Exam: General:  somewhat lethargic, arousable and appropriate responses mostly  CVS: S1-S2 clear, no murmur rubs or gallops Chest: clear to auscultation bilaterally Abdomen: soft nontender, nondistended, normal bowel sounds  Extremities: no cyanosis, clubbing or edema noted bilaterally Neuro: Cranial nerves II-XII intact, no focal neurological deficits  Lab Results: Basic Metabolic Panel:  Recent Labs Lab 01/02/14 1528 01/03/14 0330  NA 136* 141  K 3.7 3.9  CL 97 106  CO2 16* 20  GLUCOSE 174* 88  BUN 13 11  CREATININE 1.12 1.27  1.23  CALCIUM 9.2 8.6  MG 3.0*  --   PHOS  --  3.2   Liver Function Tests:  Recent Labs Lab 01/03/14 0330  AST 51*  ALT 23  ALKPHOS 70  BILITOT 0.5  PROT 6.5  ALBUMIN 3.3*   No results found for this basename: LIPASE, AMYLASE,  in the last 168 hours No results found for this basename: AMMONIA,  in the last 168 hours CBC:  Recent Labs Lab 01/02/14 1528 01/03/14 0330  WBC 10.0 12.8*  12.6*  NEUTROABS 8.4*  --   HGB 16.1 13.9  13.9  HCT 46.6 40.6  41.2  MCV 91.9 91.4  91.6  PLT 198 180  182   Cardiac Enzymes: No  results found for this basename: CKTOTAL, CKMB, CKMBINDEX, TROPONINI,  in the last 168 hours BNP: No components found with this basename: POCBNP,  CBG:  Recent Labs Lab 01/02/14 1519 01/03/14 0752  GLUCAP 163* 90     Micro Results: Recent Results (from the past 240 hour(s))  URINE CULTURE     Status: None   Collection Time    01/02/14  3:46 PM      Result Value Ref Range Status   Specimen Description URINE, CATHETERIZED   Final   Special Requests NONE   Final   Culture  Setup Time     Final   Value: 01/03/2014 03:28     [facility]formed at [facility]   Colony Count     Final   Value: NO GROWTH     [facility]formed at [facility]   Culture     Final   Value: NO GROWTH     Performed at [facility]   Report Status 01/04/2014 FINAL   Final    Studies/Results: Dg Chest 2 View  01/02/2014   CLINICAL DATA:  Status post fall backwards, hitting his head. Grand mal seizure.  EXAM: CHEST  2 VIEW  COMPARISON:  None.  FINDINGS: The heart size and mediastinal contours are within normal limits. There is no focal infiltrate, pulmonary edema, or pleural effusion. There are chronic changes of the proximal left humerus. No acute fracture or dislocation is seen.  IMPRESSION: No active cardiopulmonary disease.   Electronically Signed   By: Sherian ReinWei-Chen  Lin M.D.   On: 01/02/2014 16:50   Ct Head Wo Contrast  01/02/2014   CLINICAL DATA:  Seizures, head injury  EXAM: CT HEAD WITHOUT CONTRAST  CT CERVICAL SPINE WITHOUT CONTRAST  TECHNIQUE: Multidetector CT imaging of the head and cervical spine was performed following the standard protocol without intravenous contrast. Multiplanar CT image reconstructions of the cervical spine were also generated.  COMPARISON:  None.  FINDINGS: CT HEAD FINDINGS  No skull fracture is noted. There is scalp swelling and subcutaneous hematoma in right posterior parietal scalp. The hematoma measures 3.8 cm length by 8 mm thickness.  No mass effect or midline  shift. In axial image 10 and 11 there is tiny amount of subarachnoid hemorrhage in right sylvian fissure. No intraventricular hemorrhage.  CT CERVICAL SPINE FINDINGS  Axial images of the cervical spine shows no acute fracture or subluxation. Computer processed images shows no acute fracture or subluxation. Alignment, disc spaces and vertebral heights are preserved. No prevertebral soft tissue swelling. Cervical airway is patent.  IMPRESSION: 1. There is no mass effect or midline shift. There is small amount of subarachnoid hemorrhage in right sylvian fissure without mass effect. No intraventricular hemorrhage. Although subarachnoid hemorrhage may be post traumatic in nature ruptured intracranial aneurysm cannot be entirely excluded. Follow-up examination should be based on clinical grounds. No skull fracture is noted. There is scalp swelling and small subcutaneous hematoma in right posterior parietal scalp. 2. No cervical spine acute fracture or subluxation. No  prevertebral soft tissue swelling. These results were called by telephone at the time of interpretation on 01/02/2014 at 4:50 pm to Dr. Harle BattiestELIZABETH TYSINGER , who verbally acknowledged these results.   Electronically Signed   By: Natasha MeadLiviu  Pop M.D.   On: 01/02/2014 16:51   Ct Cervical Spine Wo Contrast  01/02/2014   CLINICAL DATA:  Seizures, head injury  EXAM: CT HEAD WITHOUT CONTRAST  CT CERVICAL SPINE WITHOUT CONTRAST  TECHNIQUE: Multidetector CT imaging of the head and cervical spine was performed following the standard protocol without intravenous contrast. Multiplanar CT image reconstructions of the cervical spine were also generated.  COMPARISON:  None.  FINDINGS: CT HEAD FINDINGS  No skull fracture is noted. There is scalp swelling and subcutaneous hematoma in right posterior parietal scalp. The hematoma measures 3.8 cm length by 8 mm thickness.  No mass effect or midline shift. In axial image 10 and 11 there is tiny amount of subarachnoid hemorrhage in  right sylvian fissure. No intraventricular hemorrhage.  CT CERVICAL SPINE FINDINGS  Axial images of the cervical spine shows no acute fracture or subluxation. Computer processed images shows no acute fracture or subluxation. Alignment, disc spaces and vertebral heights are preserved. No prevertebral soft tissue swelling. Cervical airway is patent.  IMPRESSION: 1. There is no mass effect or midline shift. There is small amount of subarachnoid hemorrhage in right sylvian fissure without mass effect. No intraventricular hemorrhage. Although subarachnoid hemorrhage may be post traumatic in nature ruptured intracranial aneurysm cannot be entirely excluded. Follow-up examination should be based on clinical grounds. No skull fracture is noted. There is scalp swelling and small subcutaneous hematoma in right posterior parietal scalp. 2. No cervical spine acute fracture or subluxation. No prevertebral soft tissue swelling. These results were called by telephone at the time of interpretation on 01/02/2014 at 4:50 pm to Dr. Harle BattiestELIZABETH TYSINGER , who verbally acknowledged these results.   Electronically Signed   By: Natasha MeadLiviu  Pop M.D.   On: 01/02/2014 16:51    Medications: Scheduled Meds: . enoxaparin (LOVENOX) injection  40 mg Subcutaneous Q24H  . nicotine  21 mg Transdermal Daily  . sodium chloride  3 mL Intravenous Q12H  . zonisamide  200 mg Oral Daily      LOS: 2 days   RAI,RIPUDEEP M.D. [facility] 01/04/2014, 10:25 AM Pager: [phone]  If 7PM-7AM, please contact night-coverage [email or web address] Password [email or web address]

## 2014-01-04 NOTE — Care Management Note (Addendum)
  Page 1 of 1   01/05/2014     10:53:50 AM CARE MANAGEMENT NOTE 01/05/2014  Patient:  Ray Hill,Ray   Account Number:  [id number]  Date Initiated:  01/04/2014  Documentation initiated by:  Elmer BalesOBARGE,Tymia Streb  Subjective/Objective Assessment:   Patient was admitted with seizure, SAH.  Admitted from home.     Action/Plan:   Wil follow for discharge needs   Anticipated DC Date:     Anticipated DC Plan:  HOME/SELF CARE  In-house referral  Artistinancial Counselor      DC Planning Services  CM consult      Choice offered to / List presented to:             Status of service:  Completed, signed off Medicare Important Message given?   (If response is "NO", the following Medicare IM given date fields will be blank) Date Medicare IM given:   Medicare IM given by:   Date Additional Medicare IM given:   Additional Medicare IM given by:    Discharge Disposition:    Per UR Regulation:  Reviewed for med. necessity/level of care/duration of stay  If discussed at Long Length of Stay Meetings, dates discussed:    Comments:  01/05/14 1045 Elmer Balesourtney Navah Grondin RN, MSN, CM- patient will be discharging home on an increased dose of Zonegran, his home medication.  CM spoke with [facility] pharmacy tech, who stated that the cash pay amount for the generic of the increased dose is $18.18/30 day supply. This information was shared with the patient, who states that he is able to afford this price.  RN was updated.   01/04/14  1100 Jalayla Chrismer RN, MSN, CM- Received a consult to assist patient with disability.  Rhonda with financial counseling notified and will contact the patient. CM will continue to follow for any discharge needs.

## 2014-01-05 LAB — BASIC METABOLIC PANEL
Anion gap: 10 (ref 5–15)
BUN: 8 mg/dL (ref 6–23)
CHLORIDE: 108 meq/L (ref 96–112)
CO2: 24 meq/L (ref 19–32)
CREATININE: 0.92 mg/dL (ref 0.50–1.35)
Calcium: 8.9 mg/dL (ref 8.4–10.5)
GFR calc Af Amer: >90 mL/min (ref >90)
GFR calc non Af Amer: >90 mL/min (ref >90)
Glucose, Bld: 96 mg/dL (ref 70–99)
Potassium: 3.6 mEq/L — ABNORMAL LOW (ref 3.7–5.3)
Sodium: 142 mEq/L (ref 137–147)

## 2014-01-05 LAB — GLUCOSE, CAPILLARY: Glucose-Capillary: 88 mg/dL (ref 70–99)

## 2014-01-05 MED ORDER — ZONISAMIDE 50 MG PO CAPS: 100.0000 mg | ORAL_CAPSULE | Freq: Every day | ORAL | Status: DC

## 2014-01-05 MED ORDER — NICOTINE 21 MG/24HR TD PT24
21.0000 mg | MEDICATED_PATCH | Freq: Every day | TRANSDERMAL | Status: DC
Start: 2014-01-05 — End: 2014-01-15

## 2014-01-05 MED ORDER — ZONISAMIDE 100 MG PO CAPS: 200.0000 mg | ORAL_CAPSULE | Freq: Every day | ORAL | Status: DC

## 2014-01-05 MED ORDER — NICOTINE 21 MG/24HR TD PT24
21.0000 mg | MEDICATED_PATCH | Freq: Every day | TRANSDERMAL | Status: DC
Start: 2014-01-05 — End: 2014-01-05

## 2014-01-05 NOTE — Progress Notes (Signed)
Subjective:  No further seizures.  No SE while in increased dose of Zonisomide Objective: Current vital signs: BP 121/73  Pulse 72  Temp(Src) 98.5 F (36.9 C) (Oral)  Resp 18  Ht 6\' 2"  (1.88 m)  Wt 71.759 kg (158 lb 3.2 oz)  BMI 20.30 kg/m2  SpO2 100% Vital signs in last 24 hours: Temp:  [98.3 F (36.8 C)-98.8 F (37.1 C)] 98.5 F (36.9 C) (10/13 0205) Pulse Rate:  [64-72] 72 (10/13 0607) Resp:  [16-20] 18 (10/13 0205) BP: (121-133)/(73-83) 121/73 mmHg (10/13 0607) SpO2:  [99 %-100 %] 100 % (10/13 0607) Weight:  [71.759 kg (158 lb 3.2 oz)] 71.759 kg (158 lb 3.2 oz) (10/13 0607)  Intake/Output from previous day: 10/12 0701 - 10/13 0700 In: 3 [I.V.:3] Out: 901 [Urine:900; Stool:1] Intake/Output this shift:   Nutritional status: General  Neurologic Exam: General: NAD Mental Status: Alert, oriented, thought content appropriate.  Speech fluent without evidence of aphasia.  Able to follow 3 step commands without difficulty. Cranial Nerves: II: Discs flat bilaterally; Visual fields grossly normal, pupils equal, round, reactive to light and accommodation III,IV, VI: ptosis not present, extra-ocular motions intact bilaterally V,VII: smile symmetric, facial light touch sensation normal bilaterally VIII: hearing normal bilaterally IX,X: gag reflex present XI: bilateral shoulder shrug XII: midline tongue extension without atrophy or fasciculations  Motor: Right : Upper extremity   5/5    Left:     Upper extremity   5/5  Lower extremity   5/5     Lower extremity   5/5 Tone and bulk:normal tone throughout; no atrophy noted Sensory: Pinprick and light touch intact throughout, bilaterally Deep Tendon Reflexes:  Right: Upper Extremity   Left: Upper extremity   biceps (C-5 to C-6) 2/4   biceps (C-5 to C-6) 2/4 tricep (C7) 2/4    triceps (C7) 2/4 Brachioradialis (C6) 2/4  Brachioradialis (C6) 2/4  Lower Extremity Lower Extremity  quadriceps (L-2 to L-4) 2/4   quadriceps (L-2  to L-4) 2/4 Achilles (S1) 2/4   Achilles (S1) 2/4  Plantars: Right: downgoing   Left: downgoing Cerebellar: normal finger-to-nose,  normal heel-to-shin test    Lab Results: Basic Metabolic Panel:  Recent Labs Lab 01/02/14 1528 01/03/14 0330 01/05/14 0629  NA 136* 141 142  K 3.7 3.9 3.6*  CL 97 106 108  CO2 16* 20 24  GLUCOSE 174* 88 96  BUN 13 11 8   CREATININE 1.12 1.27  1.23 0.92  CALCIUM 9.2 8.6 8.9  MG 3.0*  --   --   PHOS  --  3.2  --     Liver Function Tests:  Recent Labs Lab 01/03/14 0330  AST 51*  ALT 23  ALKPHOS 70  BILITOT 0.5  PROT 6.5  ALBUMIN 3.3*   No results found for this basename: LIPASE, AMYLASE,  in the last 168 hours No results found for this basename: AMMONIA,  in the last 168 hours  CBC:  Recent Labs Lab 01/02/14 1528 01/03/14 0330  WBC 10.0 12.8*  12.6*  NEUTROABS 8.4*  --   HGB 16.1 13.9  13.9  HCT 46.6 40.6  41.2  MCV 91.9 91.4  91.6  PLT 198 180  182    Cardiac Enzymes: No results found for this basename: CKTOTAL, CKMB, CKMBINDEX, TROPONINI,  in the last 168 hours  Lipid Panel: No results found for this basename: CHOL, TRIG, HDL, CHOLHDL, VLDL, LDLCALC,  in the last 168 hours  CBG:  Recent Labs Lab 01/02/14 1519 01/03/14 0752  01/04/14 1624 01/05/14 0813  GLUCAP 163* 90 91 88    Microbiology: Results for orders placed during the hospital encounter of 01/02/14  URINE CULTURE     Status: None   Collection Time    01/02/14  3:46 PM      Result Value Ref Range Status   Specimen Description URINE, CATHETERIZED   Final   Special Requests NONE   Final   Culture  Setup Time     Final   Value: 01/03/2014 03:28     Performed at [facility]   Colony Count     Final   Value: NO GROWTH     Performed at [facility]   Culture     Final   Value: NO GROWTH     Performed at [facility]   Report Status 01/04/2014 FINAL   Final    Coagulation Studies: No results found for this  basename: LABPROT, INR,  in the last 72 hours  Imaging: Ct Head Wo Contrast  01/03/2014   CLINICAL DATA:  Follow-up subarachnoid hemorrhage diagnosis yesterday.  EXAM: CT HEAD WITHOUT CONTRAST  TECHNIQUE: Contiguous axial images were obtained from the base of the skull through the vertex without intravenous contrast.  COMPARISON:  January 02, 2014  FINDINGS: The previously described right sylvian fissure subarachnoid hemorrhage is much less well seen today. No new focus of acute hemorrhage is noted. There is no midline shift, hydrocephalus, or mass. No transcortical infarct is identified. There is right parietal scalp swelling. The bony calvarium is intact. Air-fluid levels are identified within the left maxillary sinus unchanged.  IMPRESSION: The previously described right sylvian fissure subarachnoid hemorrhage is less well seen today. No new focus of acute hemorrhage is noted.   Electronically Signed   By: Sherian ReinWei-Chen  Lin M.D.   On: 01/03/2014 16:16    Medications:  Scheduled: . nicotine  21 mg Transdermal Daily  . sodium chloride  3 mL Intravenous Q12H  . 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 No further seizures since on increased dose of Zonisomide.  At this time recommend continue Zonisamide 200 mg daily. Will need out patient follow up with neurology.    [facility] [address], [place], [place] [address]--[phone] or  [facility] [address], [place], [place] [address] [phone]  No driving, operating heavy machinery, perform activities at heights, swimming or participation in water activities until release by outpatient physician.  This has been discussed with patient.   Neurology will S/O   Felicie MornDavid Ezell Poke PA-C [facility] [phone]  01/05/2014, 10:08 AM

## 2014-01-05 NOTE — Progress Notes (Signed)
D/C orders received. Pt and mother educated on d/c instructions and seizure precautions. Verbalized understanding. Pt handed d/c packet and two prescriptions. IV removed.

## 2014-01-05 NOTE — Clinical Social Work Note (Signed)
CSW met with pt and pt's mother at bedside in reference to financial assistance for pt's inability to continue working because of the seizures. Pt's mother reported she had already contacted a Development worker, community and plans to make an appointment to meet with financial counselor in the near future. Pt and pt's mother very tearful in regards to pt's medical condition. Pt's mother reported she would drive pt to work if she had to and that she was worried. CSW advised pt and pt's mother to follow up with financial counselors and visit [place] for financial assistance/resources. Pt's mother agreed she would follow up with financial counselors during this week. No further intervention needed. Pt to be discharged. CSW signing off.  Glendon Axe, MSW, LCSWA [phone] 01/05/2014 1:19 PM

## 2014-01-05 NOTE — Discharge Summary (Signed)
Physician Discharge Summary  Patient ID: Ray MaplesRahim Debski MRN: [id number] DOB/AGE: 37-05-1975 37 y.o.  Admit date: 01/02/2014 Discharge date: 01/05/2014  Primary Care Physician:  Jeanann LewandowskyJEGEDE, OLUGBEMIGA, MD  Discharge Diagnoses:    . Complex partial seizures . Smoking . Acidosis, metabolic  Consults:  Neurology, Dr. Thad Rangereynolds   Recommendations for Outpatient Follow-up:  Patient was recommended to followup with neurology outpatient, appointment was made with Dr. Karel JarvisAquino on 10/23. Patient and his mother was explained in great detail that he is not allowed to drive, operate heavy machinery, swimming or activities at heights until released by outpatient neurologist/physician  Allergies:  No Known Allergies   Discharge Medications:   Medication List         nicotine 21 mg/24hr patch  Commonly known as:  NICODERM CQ - dosed in mg/24 hours  Place 1 patch (21 mg total) onto the skin daily.     zonisamide 100 MG capsule  Commonly known as:  ZONEGRAN  Take 2 capsules (200 mg total) by mouth daily.         Brief H and P: For complete details please refer to admission H and P, but in brief Ray Hill is a 38 y.o. male  With history of complex partial seizures which [facility], was noted on EEG arising from the right temporal hemisphere. Patient presented to the emergency room with [facility] was noted to have a grand mal seizure for approximately a minute was subsequently stopped and then another one lasting a few seconds. Patient was noted to have fallen and struck his head from the seizure. Patient was reportedly combative and had received Haldol in the field of Versed in the field per EMS. When patient was brought to the emergency room he was postictal. At the time of admission, patient wasn't postictal phase and did not remember what had happened. He denied any alcohol use.  Patient stated that he was was on Tegretol  which was subsequently changed to zonisamide 100 mg daily. He reported compliance with medications.  BMET showed sodium of 136 bicarbonate of 16 otherwise was within normal limits. CBC was unremarkable. Tegretol level was less than 0.5. Glucose level was at 174. Chest x-ray that was negative for any acute cardiopulmonary disease. CT of the head and C-spine done showed a small amount of subarachnoid hemorrhage in the right sylvian fissure without mass effect or midline shift. No acute cervical spine fracture or subluxation.   Hospital Course:  The patient is a [age] year old male with history of seizures [place] presented with seizure, fell and struck his head. CT showed minimal right traumatic SAH. Repeat CT head stable. Patient was cleared by neurosurgery, Dr. Lovell SheehanJenkins. Neurology was also consulted for seizures.  SAH (subarachnoid hemorrhage)  - Stable, repeat CT head on 10/11 showed stable and less SAH. Patient remained stable and was cleared by neurosurgery, Dr. Lovell SheehanJenkins.   History of focal epilepsy with seizures  Neurology was consulted and patient was placed on increased dose of zonisamide at 200 mg. Patient however had a repeat seizure early morning on 10/12. Patient was seen by neurology and recommended to continue the same dose of zonisamide as would like to give more time for AED to reach steady-state before adjusting medications. [facility], per patient's mother, [place] issues. Outpatient followup appointment was made with Dr. [place] LionsAcquino on 01/05/14. Patient was placed on restrictions not  to drive, swimming, activities at height or in water or operating heavy machinery until he is cleared by outpatient neurology.  Smoking  - Patient counseled on smoking cessation, continue nicotine patch   Acidosis, metabolic  -Resolved likely due to Dehydration and seizures, patient was given IV fluid  hydration.     Day of Discharge BP 123/76  Pulse 65  Temp(Src) 98.5 F (36.9 C) (Oral)  Resp 18  Ht 6\' 2"  (1.88 m)  Wt 71.759 kg (158 lb 3.2 oz)  BMI 20.30 kg/m2  SpO2 100%  Physical Exam: General: Alert and awake oriented x3 not in any acute distress. HEENT: anicteric sclera, pupils reactive to light and accommodation CVS: S1-S2 clear no murmur rubs or gallops Chest: clear to auscultation bilaterally, no wheezing rales or rhonchi Abdomen: soft nontender, nondistended, normal bowel sounds Extremities: no cyanosis, clubbing or edema noted bilaterally Neuro: Cranial nerves II-XII intact, no focal neurological deficits   The results of significant diagnostics from this hospitalization (including imaging, microbiology, ancillary and laboratory) are listed below for reference.    LAB RESULTS: Basic Metabolic Panel:  Recent Labs Lab 01/02/14 1528 01/03/14 0330 01/05/14 0629  NA 136* 141 142  K 3.7 3.9 3.6*  CL 97 106 108  CO2 16* 20 24  GLUCOSE 174* 88 96  BUN 13 11 8   CREATININE 1.12 1.27  1.23 0.92  CALCIUM 9.2 8.6 8.9  MG 3.0*  --   --   PHOS  --  3.2  --    Liver Function Tests:  Recent Labs Lab 01/03/14 0330  AST 51*  ALT 23  ALKPHOS 70  BILITOT 0.5  PROT 6.5  ALBUMIN 3.3*   No results found for this basename: LIPASE, AMYLASE,  in the last 168 hours No results found for this basename: AMMONIA,  in the last 168 hours CBC:  Recent Labs Lab 01/02/14 1528 01/03/14 0330  WBC 10.0 12.8*  12.6*  NEUTROABS 8.4*  --   HGB 16.1 13.9  13.9  HCT 46.6 40.6  41.2  MCV 91.9 91.4  91.6  PLT 198 180  182   Cardiac Enzymes: No results found for this basename: CKTOTAL, CKMB, CKMBINDEX, TROPONINI,  in the last 168 hours BNP: No components found with this basename: POCBNP,  CBG:  Recent Labs Lab 01/04/14 1624 01/05/14 0813  GLUCAP 91 88    Significant Diagnostic Studies:  Dg Chest 2 View  01/02/2014   CLINICAL DATA:  Status post fall  backwards, hitting his head. Grand mal seizure.  EXAM: CHEST  2 VIEW  COMPARISON:  None.  FINDINGS: The heart size and mediastinal contours are within normal limits. There is no focal infiltrate, pulmonary edema, or pleural effusion. There are chronic changes of the proximal left humerus. No acute fracture or dislocation is seen.  IMPRESSION: No active cardiopulmonary disease.   Electronically Signed   By: Sherian Rein M.D.   On: 01/02/2014 16:50   Ct Head Wo Contrast  01/03/2014   CLINICAL DATA:  Follow-up subarachnoid hemorrhage diagnosis yesterday.  EXAM: CT HEAD WITHOUT CONTRAST  TECHNIQUE: Contiguous axial images were obtained from the base of the skull through the vertex without intravenous contrast.  COMPARISON:  January 02, 2014  FINDINGS: The previously described right sylvian fissure subarachnoid hemorrhage is much less well seen today. No new focus of acute hemorrhage is noted. There is no midline shift, hydrocephalus, or mass. No transcortical infarct is identified. There is right parietal scalp swelling. The bony calvarium is intact. Air-fluid  levels are identified within the left maxillary sinus unchanged.  IMPRESSION: The previously described right sylvian fissure subarachnoid hemorrhage is less well seen today. No new focus of acute hemorrhage is noted.   Electronically Signed   By: Sherian Rein M.D.   On: 01/03/2014 16:16   Ct Head Wo Contrast  01/02/2014   CLINICAL DATA:  Seizures, head injury  EXAM: CT HEAD WITHOUT CONTRAST  CT CERVICAL SPINE WITHOUT CONTRAST  TECHNIQUE: Multidetector CT imaging of the head and cervical spine was performed following the standard protocol without intravenous contrast. Multiplanar CT image reconstructions of the cervical spine were also generated.  COMPARISON:  None.  FINDINGS: CT HEAD FINDINGS  No skull fracture is noted. There is scalp swelling and subcutaneous hematoma in right posterior parietal scalp. The hematoma measures 3.8 cm length by 8 mm  thickness.  No mass effect or midline shift. In axial image 10 and 11 there is tiny amount of subarachnoid hemorrhage in right sylvian fissure. No intraventricular hemorrhage.  CT CERVICAL SPINE FINDINGS  Axial images of the cervical spine shows no acute fracture or subluxation. Computer processed images shows no acute fracture or subluxation. Alignment, disc spaces and vertebral heights are preserved. No prevertebral soft tissue swelling. Cervical airway is patent.  IMPRESSION: 1. There is no mass effect or midline shift. There is small amount of subarachnoid hemorrhage in right sylvian fissure without mass effect. No intraventricular hemorrhage. Although subarachnoid hemorrhage may be post traumatic in nature ruptured intracranial aneurysm cannot be entirely excluded. Follow-up examination should be based on clinical grounds. No skull fracture is noted. There is scalp swelling and small subcutaneous hematoma in right posterior parietal scalp. 2. No cervical spine acute fracture or subluxation. No prevertebral soft tissue swelling. These results were called by telephone at the time of interpretation on 01/02/2014 at 4:50 pm to Dr. Harle Battiest , who verbally acknowledged these results.   Electronically Signed   By: Natasha Mead M.D.   On: 01/02/2014 16:51   Ct Cervical Spine Wo Contrast  01/02/2014   CLINICAL DATA:  Seizures, head injury  EXAM: CT HEAD WITHOUT CONTRAST  CT CERVICAL SPINE WITHOUT CONTRAST  TECHNIQUE: Multidetector CT imaging of the head and cervical spine was performed following the standard protocol without intravenous contrast. Multiplanar CT image reconstructions of the cervical spine were also generated.  COMPARISON:  None.  FINDINGS: CT HEAD FINDINGS  No skull fracture is noted. There is scalp swelling and subcutaneous hematoma in right posterior parietal scalp. The hematoma measures 3.8 cm length by 8 mm thickness.  No mass effect or midline shift. In axial image 10 and 11 there is  tiny amount of subarachnoid hemorrhage in right sylvian fissure. No intraventricular hemorrhage.  CT CERVICAL SPINE FINDINGS  Axial images of the cervical spine shows no acute fracture or subluxation. Computer processed images shows no acute fracture or subluxation. Alignment, disc spaces and vertebral heights are preserved. No prevertebral soft tissue swelling. Cervical airway is patent.  IMPRESSION: 1. There is no mass effect or midline shift. There is small amount of subarachnoid hemorrhage in right sylvian fissure without mass effect. No intraventricular hemorrhage. Although subarachnoid hemorrhage may be post traumatic in nature ruptured intracranial aneurysm cannot be entirely excluded. Follow-up examination should be based on clinical grounds. No skull fracture is noted. There is scalp swelling and small subcutaneous hematoma in right posterior parietal scalp. 2. No cervical spine acute fracture or subluxation. No prevertebral soft tissue swelling. These results were called by telephone at  the time of interpretation on 01/02/2014 at 4:50 pm to Dr. Harle BattiestELIZABETH TYSINGER , who verbally acknowledged these results.   Electronically Signed   By: Natasha MeadLiviu  Pop M.D.   On: 01/02/2014 16:51       Disposition and Follow-up: Discharge Instructions   Discharge instructions    Complete by:  As directed   No driving, operating heavy machinery, perform activities at heights, swimming or participation in water activities until released by outpatient physician or neurologist.     Increase activity slowly    Complete by:  As directed             DISPOSITION: Home  DIET: Regular diet    DISCHARGE FOLLOW-UP Follow-up Information   Follow up with Van ClinesAquino,Karen M, MD On 01/15/2014. (AT 10:30am, for hospital follow-up)    Specialty:  Neurology   Contact information:   [address] [place] [address] [phone]       Time spent on Discharge: 40 mins  Signed:   RAI,RIPUDEEP  M.D. [facility] 01/05/2014, 10:31 AM Pager: [phone]

## 2014-01-06 NOTE — ED Provider Notes (Signed)
Medical screening examination/treatment/procedure(s) were conducted as a shared visit with non-physician practitioner(s) and myself.  I personally evaluated the patient during the encounter.   EKG Interpretation   Date/Time:  Saturday January 02 2014 19:53:12 EDT Ventricular Rate:  102 PR Interval:  120 QRS Duration: 104 QT Interval:  338 QTC Calculation: 440 R Axis:   27 Text Interpretation:  Sinus tachycardia Abnormal R-wave progression, early  transition ED PHYSICIAN INTERPRETATION AVAILABLE IN [facility]  Confirmed by TEST, Record ([id number]) on 01/04/2014 7:04:07 AM     CRITICAL CARE Performed by: Raeford RazorKOHUT, Kinsler Soeder Total critical care time: 30 minutes  Critical care time was exclusive of separately billable procedures and treating other patients. Critical care was necessary to treat or prevent imminent or life-threatening deterioration. Critical care was time spent personally by me on the following activities: development of treatment plan with patient and/or surrogate as well as nursing, discussions with consultants, evaluation of patient's response to treatment, examination of patient, obtaining history from patient or surrogate, ordering and performing treatments and interventions, ordering and review of laboratory studies, ordering and review of radiographic studies, pulse oximetry and re-evaluation of patient's condition.   Raeford RazorStephen Jun Osment, MD 01/06/14 [phone]

## 2014-01-15 ENCOUNTER — Ambulatory Visit (INDEPENDENT_AMBULATORY_CARE_PROVIDER_SITE_OTHER): Payer: Self-pay | Admitting: Neurology

## 2014-01-15 ENCOUNTER — Emergency Department (HOSPITAL_COMMUNITY)
Admission: EM | Admit: 2014-01-15 | Discharge: 2014-01-15 | Disposition: A | Discharge status: Home / Self Care | Payer: MEDICAID | Attending: Emergency Medicine | Admitting: Emergency Medicine

## 2014-01-15 ENCOUNTER — Encounter (HOSPITAL_COMMUNITY): Payer: Self-pay | Admitting: Emergency Medicine

## 2014-01-15 ENCOUNTER — Encounter: Payer: Self-pay | Admitting: Neurology

## 2014-01-15 VITALS — BP 100/80 | HR 74 | Resp 18 | Ht 74.0 in | Wt 156.0 lb

## 2014-01-15 DIAGNOSIS — G40909 Epilepsy, unspecified, not intractable, without status epilepticus: Secondary | ICD-10-CM | POA: Insufficient documentation

## 2014-01-15 DIAGNOSIS — Z720 Tobacco use: Secondary | ICD-10-CM | POA: Insufficient documentation

## 2014-01-15 DIAGNOSIS — Z4802 Encounter for removal of sutures: Secondary | ICD-10-CM | POA: Insufficient documentation

## 2014-01-15 DIAGNOSIS — G40009 Localization-related (focal) (partial) idiopathic epilepsy and epileptic syndromes with seizures of localized onset, not intractable, without status epilepticus: Secondary | ICD-10-CM

## 2014-01-15 MED ORDER — ZONISAMIDE 100 MG PO CAPS: ORAL_CAPSULE | ORAL | Status: DC

## 2014-01-15 NOTE — Discharge Instructions (Signed)
Suture Removal, Care After  These instructions provide you with information on caring for yourself after your procedure. Your health care provider may also give you more specific instructions. Your treatment has been planned according to current medical practices, but problems sometimes occur. Call your health care provider if you have any problems or questions after your procedure. WHAT TO EXPECT AFTER THE PROCEDURE After your staples are removed, it is typical to have the following:  Some discomfort and swelling in the wound area.  Slight redness in the area. HOME CARE INSTRUCTIONS   Keep the wound area dry and clean. If the bandage becomes wet or dirty, or if it develops a bad smell, change it as soon as possible.  Continue to protect the wound from injury.  Use sunscreen when out in the sun. New scars become sunburned easily. SEEK MEDICAL CARE IF:  You have increasing redness, swelling, or pain in the wound.  You see pus coming from the wound.  You have a fever.  You notice a bad smell coming from the wound or dressing.  Your wound breaks open (edges not staying together). Document Released: 12/05/2000 Document Revised: 12/31/2012 Document Reviewed: 10/22/2012 [facility] Patient Information 2015 [employer] [place]. This information is not intended to replace advice given to you by your health care provider. Make sure you discuss any questions you have with your health care provider.

## 2014-01-15 NOTE — Progress Notes (Signed)
NEUROLOGY CONSULTATION NOTE  Ray Hill MRN: [id number] DOB: [birth year]  Referring provider:  Dr. Ramiro Harvest Primary care provider: Dr. Jeanann Lewandowsky  Reason for consult:  seizures  Dear Dr Janee Morn:  Thank you for your kind referral of Ray Hill for consultation of the above symptoms. Although his history is well known to you, please allow me to reiterate it for the purpose of our medical record. The patient was accompanied to the clinic by his mother who also provides collateral information. Records and images were personally reviewed where available.  HISTORY OF PRESENT ILLNESS: This is a 38 year old right-handed man with a history of seizures since age 36. His mother has witnessed the seizures that occur in the early morning hours out of sleep. His mother would hear him convulsing, he would struggle to get out of the bed and be confused for 30 minutes or so, or go back to sleep. Seizures last 5-10 minutes. They were occurring approximately every 4 months.  He was initially seen by Dr. Terrace Arabia, records unavailable for review. He was evaluated at [facility] in January 2015, where he reported that alcohol consumption had increased but was unable to correlate if seizures increased around more alcohol intake. He reported chronic left shoulder pain due to falling out of the bed after a seizure a few years ago, unable to lift his arm a little more than above the shoulder.  He had an MRI brain on 04/28/13 which reported a few subcortical and deep cerebral white matter punctate foci of increased T2/FLAIR signal, otherwise unremarkable. Images unavailable for review. He had a routine EEG which was abnormal, with right temporal slowing and sharp waves, as well as capturing one electroclinical seizure arising from the right temporal region with report of hand and mouth automatisms that arose out of drowsiness. He was started on low dose Zonegran and had been taking 100mg /day. He had a nocturnal  seizure on 01/01/14, then another seizure on 01/02/14 which is the first time it occurred while awake. He recalls having a sensation of deja vu 15 to 20 minutes prior the seizure, then was amnestic of the event. He denied any prior sleep deprivation and had reduced alcohol intake. He was brought to [facility] where he was found to have a small amount of subarachnoid hemorrhage in the right sylvian fissure without mass effect. Repeat head CT on 10/11 noted that this was much less well seen. He was evaluated and cleared from neurosurgical standpoint. His Zonegran was increased to 200mg /day. He had another seizure while admitted to the hospital on 01/04/14 with confusion and picking at his clothing lasting 2 minutes. He denies any post-ictal focal weakness.   He reports having deja vu around once a month. He denies anyolfactory/gustatory hallucinations,  rising epigastric sensation, focal numbness/tingling/weakness, myoclonic jerks. He and his mother report problems with his memory. He is not driving and is currently unemployed. His mother has noticed increased irritability, moodiness, and agitation since starting Zonegran. He denies any suicidal or homicidal ideation.  He denies any frequent headaches except when he shakes his head. No dizziness, diplopia, dysarthria, dysphagia, neck/back pain, bowel/bladder dysfunction. He reports occasionally dropping things in his right hand but denies clear weakness. He finished high school in regular classes. In the past he took Tegretol, this was discontinued due to cost.   Epilepsy Risk Factors:  He had a normal birth and early development.  There is no history of febrile convulsions, CNS infections such as meningitis/encephalitis, significant traumatic brain  injury, neurosurgical procedures, or family history of seizures.  Laboratory Data:  Lab Results  Component Value Date   WBC 12.8* 01/03/2014   WBC 12.6* 01/03/2014   HGB 13.9 01/03/2014   HGB 13.9 01/03/2014   HCT  40.6 01/03/2014   HCT 41.2 01/03/2014   MCV 91.4 01/03/2014   MCV 91.6 01/03/2014   PLT 180 01/03/2014   PLT 182 01/03/2014     Chemistry      Component Value Date/Time   NA 142 01/05/2014 0629   K 3.6* 01/05/2014 0629   CL 108 01/05/2014 0629   CO2 24 01/05/2014 0629   BUN 8 01/05/2014 0629   CREATININE 0.92 01/05/2014 0629      Component Value Date/Time   CALCIUM 8.9 01/05/2014 0629   ALKPHOS 70 01/03/2014 0330   AST 51* 01/03/2014 0330   ALT 23 01/03/2014 0330   BILITOT 0.5 01/03/2014 0330     EEGs: 06/02/13 at [facility]: Intermittent right temporal delta slowing is seen. Frequent right temporal sharp waves are seen, maximal at F8; a brief semi-periodic run of these at 0.5 to 1Hz  occurs in the post hyperventilation period. During the recording, the patient has one electroclinical seizure with hand and mouth automatisms that arose out of drowsiness. The EEG shows arousal from stage N1 sleep at 14:12:54, and after clinical onset, at 14:13:04 electrographic onset occurs with 7Hz   rhythmic theta frequenices in the right temporal region which quickly spread throughout the right hemisphere and then to the midline and left parasagittal derivations while slowing to 4.5Hz  rhythmic theta. The ictal rhythm then further evolved to somewhat sharply contoured delta frequencies with superimposed faster frequencies which were again more localized to the right hemisphere, maximal in the right temporal region and which slowed to around 2Hz  prior to offset at 14:14:05.   PAST MEDICAL HISTORY: Past Medical History  Diagnosis Date  . Seizures     PAST SURGICAL HISTORY: No past surgical history on file.  MEDICATIONS: No current outpatient prescriptions on file prior to visit.   No current facility-administered medications on file prior to visit.    ALLERGIES: No Known Allergies  FAMILY HISTORY: Family History  Problem Relation Age of Onset  . Cancer Maternal Grandmother     ovarian cancer      SOCIAL HISTORY: History   Social History  . Marital Status: Single    Spouse Name: N/A    Number of Children: N/A  . Years of Education: N/A   Occupational History  . Not on file.   Social History Main Topics  . Smoking status: Current Every Day Smoker -- 1.00 packs/day    Types: Cigarettes  . Smokeless tobacco: Never Used  . Alcohol Use: No     Comment: daily  . Drug Use: Yes    Special: Marijuana  . Sexual Activity: Not on file   Other Topics Concern  . Not on file   Social History Narrative  . No narrative on file    REVIEW OF SYSTEMS: Constitutional: No fevers, chills, or sweats, no generalized fatigue, change in appetite Eyes: No visual changes, double vision, eye pain Ear, nose and throat: No hearing loss, ear pain, nasal congestion, sore throat Cardiovascular: No chest pain, palpitations Respiratory:  No shortness of breath at rest or with exertion, wheezes GastrointestinaI: No nausea, vomiting, diarrhea, abdominal pain, fecal incontinence Genitourinary:  No dysuria, urinary retention or frequency Musculoskeletal:  No neck pain, back pain Integumentary: No rash, pruritus, skin lesions Neurological: as above Psychiatric:  No depression, insomnia, anxiety Endocrine: No palpitations, fatigue, diaphoresis, mood swings, change in appetite, change in weight, increased thirst Hematologic/Lymphatic:  No anemia, purpura, petechiae. Allergic/Immunologic: no itchy/runny eyes, nasal congestion, recent allergic reactions, rashes  PHYSICAL EXAM: Filed Vitals:   01/15/14 1052  BP: 100/80  Pulse: 74  Resp: 18   General: No acute distress Head:  Normocephalic/atraumatic Eyes: Fundoscopic exam shows bilateral sharp discs, no vessel changes, exudates, or hemorrhages Neck: supple, no paraspinal tenderness, full range of motion Back: No paraspinal tenderness Heart: regular rate and rhythm Lungs: Clear to auscultation bilaterally. Vascular: No carotid  bruits. Skin/Extremities: No rash, no edema Neurological Exam: Mental status: alert and oriented to person, place, and time, no dysarthria or aphasia, Fund of knowledge is appropriate.  Remote memory intact. Delayed recall 1/3.  Attention and concentration are normal.    Able to name objects and repeat phrases. Cranial nerves: CN I: not tested CN II: pupils equal, round and reactive to light, visual fields intact, fundi unremarkable. CN III, IV, VI:  full range of motion, no nystagmus, no ptosis CN V: facial sensation intact CN VII: upper and lower face symmetric CN VIII: hearing intact to finger rub CN IX, X: gag intact, uvula midline CN XI: sternocleidomastoid and trapezius muscles intact CN XII: tongue midline Bulk & Tone: normal, no fasciculations. Motor: 5/5 throughout with no pronator drift. Sensation: intact to light touch, cold, pin, vibration and joint position sense.  No extinction to double simultaneous stimulation.  Romberg test negative Deep Tendon Reflexes: +2 throughout, no ankle clonus Plantar responses: downgoing bilaterally Cerebellar: no incoordination on finger to nose, heel to shin. No dysdiadochokinesia Gait: narrow-based and steady, able to tandem walk adequately. Tremor: none  IMPRESSION: This is a 38 year old right-handed man with a history of right temporal lobe epilepsy. He had been having nocturnal convulsions since age 38, and had his first seizure out of wakefulness last 01/02/14. He also reports episodes of deja vu once a month. His routine EEG showed right temporal slowing and sharp waves, with one electroclinical seizure captured arising from the right temporal region. He is currently on Zonegran 200mg /day and will increase this to 300mg /day. We discussed side effects of the medication, he has been more irritable, and may benefit from a mood stabilizing AED such as Lamotrigine, however due to cost issues, we have agreed to stay on Zonisamide for now until  insurance issues are settled. We discussed avoidance of seizure triggers, including alcohol, sleep deprivation, and missing medications. Seizure precautions, including no operating heavy machinery, working from heights or in front of hot grill, taking showers instead of baths, no swimming alone, and no driving until 6 months seizure-free were discussed. He will follow-up in 3 months and knows to call our office for any problems.  Thank you for allowing me to participate in the care of this patient. Please do not hesitate to call for any questions or concerns.   Patrcia DollyKaren Twanisha Foulk, M.D.  CC: Dr. Hyman HopesJegede

## 2014-01-15 NOTE — ED Notes (Signed)
Pt here for removal of head staples placed on 10/10. Has not had any problems with staple site.

## 2014-01-15 NOTE — ED Provider Notes (Signed)
CSN: [id number]     Arrival date & time 01/15/14  1148 History  This chart was scribed for non-physician practitioner, Junius FinnerErin O'Malley, PA-C working with Raeford RazorStephen Kohut, MD, by Jarvis Morganaylor Ferguson, ED Scribe. This patient was seen in room WTR7/WTR7 and the patient's care was started at 12:15 PM.    Chief Complaint  Patient presents with  . Suture / Staple Removal    The history is provided by the patient. No language interpreter was used.    HPI Comments: Ray Hill is a 38 y.o. male who presents to the Emergency Department for a removal of staples in the back of his head. Pt had 6 staples placed on 01/02/14. Pt sustained the injury to his head when he had a seizure and fell. Per ED notes from 10/10, pt had imaging done which was significant for small SAH R Sylvian fissure. He also had a mild confusion per examination. Neuro exam was nonfocal otherwise. Pt states that he has not had any problems with the staples. He denies any fever, chills, nausea, vomiting, diarrhea, pain or drainage from the area.   Past Medical History  Diagnosis Date  . Seizures    History reviewed. No pertinent past surgical history. Family History  Problem Relation Age of Onset  . Cancer Maternal Grandmother     ovarian cancer    History  Substance Use Topics  . Smoking status: Current Every Day Smoker -- 1.00 packs/day    Types: Cigarettes  . Smokeless tobacco: Never Used  . Alcohol Use: No     Comment: daily    Review of Systems  Constitutional: Negative for fever and chills.  Gastrointestinal: Negative for nausea, vomiting and diarrhea.  Skin: Positive for wound (prior laceration).       Negative for drainage from staple site  Neurological: Negative for headaches.  All other systems reviewed and are negative.     Allergies  Review of patient's allergies indicates no known allergies.  Home Medications   Prior to Admission medications   Medication Sig Start Date End Date Taking? Authorizing  Provider  zonisamide (ZONEGRAN) 100 MG capsule Take 3 caps at bedtime 01/15/14   Van ClinesKaren M Aquino, MD   Triage Vitals: BP 115/76  Pulse 88  Temp(Src) 98.8 F (37.1 C) (Oral)  Resp 16  SpO2 100%  Physical Exam  Nursing note and vitals reviewed. Constitutional: He is oriented to person, place, and time. He appears well-developed and well-nourished.  HENT:  Head: Normocephalic.  Eyes: EOM are normal.  Neck: Normal range of motion.  Cardiovascular: Normal rate.   Pulmonary/Chest: Effort normal.  Musculoskeletal: Normal range of motion.  Neurological: He is alert and oriented to person, place, and time.  Skin: Skin is warm and dry.  Scalp: well healing laceration to right posterior scalp. Small scab, no active bleeding or discharge. Six staples in place. Non-tender.   Psychiatric: He has a normal mood and affect. His behavior is normal.    ED Course  Procedures (including critical care time)  DIAGNOSTIC STUDIES: Oxygen Saturation is 100% on RA, normal by my interpretation.    COORDINATION OF CARE: 12:22 PM  SUTURE REMOVAL Performed by: Kerman Passeyachel Keslar, RN Consent: Verbal consent obtained. Patient identity confirmed: provided demographic data Time out: Immediately prior to procedure a "time out" was called to verify the correct patient, procedure, equipment, support staff and site/side marked as required. Location: back of scalp Wound Appearance: clean Sutures/Staples Removed: 6 Patient tolerance: Patient tolerated the procedure well with no immediate  complications.    Labs Review Labs Reviewed - No data to display  Imaging Review No results found.   EKG Interpretation None      MDM   Final diagnoses:  Encounter for staple removal    Pt presenting to ED for staple removal. Wound appears to be healing well, small scab over laceration. No evidence of underlying infect. Six staples removed, see procedure note. Home care instructions provided. Pt verbalized  understanding and agreement with tx plan.   I personally performed the services described in this documentation, which was scribed in my presence. The recorded information has been reviewed and is accurate.      Junius Finnerrin O'Malley, PA-C 01/15/14 1302

## 2014-01-15 NOTE — Patient Instructions (Signed)
1. Increase Zonisamide 100mg  to 3 caps daily starting Monday 01/18/14 2. Continue to monitor mood 3. Follow-up in 3 months  Seizure Precautions: 1. If medication has been prescribed for you to prevent seizures, take it exactly as directed.  Do not stop taking the medicine without talking to your doctor first, even if you have not had a seizure in a long time.   2. Avoid activities in which a seizure would cause danger to yourself or to others.  Don't operate dangerous machinery, swim alone, or climb in high or dangerous places, such as on ladders, roofs, or girders.  Do not drive unless your doctor says you may.  3. If you have any warning that you may have a seizure, lay down in a safe place where you can't hurt yourself.    4.  No driving for 6 months from last seizure, as [place] [facility] [place].   Please refer to the following link on the Epile[employer] of [employer]'s website for more information: http://www.epilepsyfoundation.org/answerplace/Social/driving/drivingu.cfm   5.  Maintain good sleep hygiene.  6.  Contact your doctor if you have any problems that may be related to the medicine you are taking.  7.  Call 911 and bring the patient back to the ED if:        A.  The seizure lasts longer than 5 minutes.       B.  The patient doesn't awaken shortly after the seizure  C.  The patient has new problems such as difficulty seeing, speaking or moving  D.  The patient was injured during the seizure  E.  The patient has a temperature over 102 F (39C)  F.  The patient vomited and now is having trouble breathing

## 2014-01-18 NOTE — ED Provider Notes (Signed)
Medical screening examination/treatment/procedure(s) were performed by non-physician practitioner and as supervising physician I was immediately available for consultation/collaboration.   EKG Interpretation None       Druanne Bosques, MD 01/18/14 1326 

## 2014-01-20 ENCOUNTER — Encounter: Payer: Self-pay | Admitting: Neurology

## 2014-01-20 DIAGNOSIS — G40009 Localization-related (focal) (partial) idiopathic epilepsy and epileptic syndromes with seizures of localized onset, not intractable, without status epilepticus: Secondary | ICD-10-CM | POA: Insufficient documentation

## 2014-02-23 ENCOUNTER — Telehealth: Payer: Self-pay | Admitting: Neurology

## 2014-02-23 NOTE — Telephone Encounter (Signed)
recv'd request for records via mail from [employer]. Forwarded via [employer] [facility] / Sherri S.

## 2014-03-04 ENCOUNTER — Ambulatory Visit: Payer: Self-pay | Admitting: Neurology

## 2014-03-04 ENCOUNTER — Telehealth: Payer: Self-pay | Admitting: Neurology

## 2014-03-04 NOTE — Telephone Encounter (Signed)
Pt's mother called at [id number], says she is having a difficult time getting patient out of the bed. Pt will not be here for today's appt but he has been r/s for tomorrow. Today's appt is considered a no show as the patient called the day of to r/s but a letter does not need to be sent / Sherri S.

## 2014-03-05 ENCOUNTER — Ambulatory Visit (INDEPENDENT_AMBULATORY_CARE_PROVIDER_SITE_OTHER): Payer: Self-pay | Admitting: Neurology

## 2014-03-05 ENCOUNTER — Encounter: Payer: Self-pay | Admitting: Neurology

## 2014-03-05 ENCOUNTER — Telehealth: Payer: Self-pay | Admitting: Neurology

## 2014-03-05 VITALS — BP 98/78 | HR 80 | Resp 16 | Ht 74.0 in | Wt 153.0 lb

## 2014-03-05 DIAGNOSIS — G40009 Localization-related (focal) (partial) idiopathic epilepsy and epileptic syndromes with seizures of localized onset, not intractable, without status epilepticus: Secondary | ICD-10-CM

## 2014-03-05 MED ORDER — LAMOTRIGINE 25 MG PO TABS: ORAL_TABLET | ORAL | Status: DC

## 2014-03-05 MED ORDER — ZONISAMIDE 100 MG PO CAPS: ORAL_CAPSULE | ORAL | Status: DC

## 2014-03-05 NOTE — Patient Instructions (Signed)
1. Start Lamotrigine 25mg : Take 1 tablet twice a day for 2 weeks, then increase to 2 tablets twice a day 2. Continue Zonegran 100mg : Take 2 capsules daily 3. Follow-up in 1 month  Seizure Precautions: 1. If medication has been prescribed for you to prevent seizures, take it exactly as directed.  Do not stop taking the medicine without talking to your doctor first, even if you have not had a seizure in a long time.   2. Avoid activities in which a seizure would cause danger to yourself or to others.  Don't operate dangerous machinery, swim alone, or climb in high or dangerous places, such as on ladders, roofs, or girders.  Do not drive unless your doctor says you may.  3. If you have any warning that you may have a seizure, lay down in a safe place where you can't hurt yourself.    4.  No driving for 6 months from last seizure, as per [facility] [place].   Please refer to the following link on the Epile[employer] Foundation of [employer]'s website for more information: http://www.epilepsyfoundation.org/answerplace/Social/driving/drivingu.cfm   5.  Maintain good sleep hygiene.  6.  Contact your doctor if you have any problems that may be related to the medicine you are taking.  7.  Call 911 and bring the patient back to the ED if:        A.  The seizure lasts longer than 5 minutes.       B.  The patient doesn't awaken shortly after the seizure  C.  The patient has new problems such as difficulty seeing, speaking or moving  D.  The patient was injured during the seizure  E.  The patient has a temperature over 102 F (39C)  F.  The patient vomited and now is having trouble breathing

## 2014-03-05 NOTE — Progress Notes (Signed)
NEUROLOGY FOLLOW UP OFFICE NOTE  Ray Hill [id number]  HISTORY OF PRESENT ILLNESS: I had the pleasure of seeing Ray Hill in follow-up in the neurology clinic on 03/05/2014.  The patient was last seen 2 months ago for recurrent predominantly nocturnal seizures. 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 MRI brain unremarkable . He is again accompanied by his mother today. Since his last visit, he had self-reduced Zonegran to 200mg  qhs due to poor appetite, weight loss, and increased irritability. He had 2 nocturnal seizures since his last visit, on 02/24/14 and 03/01/14. He had bitten the inside of his mouth, no other injuries. He denies any headaches, dizziness, diplopia, focal numbness/tingling/weakness.   HPI:  This is a 38 yo RH man with a history of seizures since age 38. His mother has witnessed the seizures that occur in the early morning hours out of sleep. His mother would hear him convulsing, he would struggle to get out of the bed and be confused for 30 minutes or so, or go back to sleep. Seizures last 5-10 minutes. They were occurring approximately every 4 months. He was initially seen by Dr. Terrace ArabiaYan, records unavailable for review. [facility], where he reported that alcohol consumption had increased but was unable to correlate if seizures increased around more alcohol intake. He reported chronic left shoulder pain due to falling out of the bed after a seizure a few years ago, unable to lift his arm a little more than above the shoulder. He had an MRI brain on 04/28/13 which reported a few subcortical and deep cerebral white matter punctate foci of increased T2/FLAIR signal, otherwise unremarkable. Images unavailable for review. He had a routine EEG which was abnormal, with right temporal slowing and sharp waves, as well as capturing one electroclinical seizure arising from the right temporal region with  report of hand and mouth automatisms that arose out of drowsiness. He was started on low dose Zonegran and had been taking 100mg /day. He had a nocturnal seizure on 01/01/14, then another seizure on 01/02/14 which is the first time it occurred while awake. He recalls having a sensation of deja vu 15 to 20 minutes prior the seizure, then was amnestic of the event. He denied any prior sleep deprivation and had reduced alcohol intake. [facility] where he was found to have a small amount of subarachnoid hemorrhage in the right sylvian fissure without mass effect. Repeat head CT on 10/11 noted that this was much less well seen. He was evaluated and cleared from neurosurgical standpoint. His Zonegran was increased to 200mg /day. He had another seizure while admitted to the hospital on 01/04/14 with confusion and picking at his clothing lasting 2 minutes. He denies any post-ictal focal weakness.   He reports having deja vu around once a month. He denies anyolfactory/gustatory hallucinations, rising epigastric sensation, focal numbness/tingling/weakness, myoclonic jerks. He and his mother report problems with his memory. He is not driving and is currently unemployed. His mother has noticed increased irritability, moodiness, and agitation since starting Zonegran. He denies any suicidal or homicidal ideation.  Epilepsy Risk Factors: He had a normal birth and early development. There is no history of febrile convulsions, CNS infections such as meningitis/encephalitis, significant traumatic brain injury, neurosurgical procedures, or family history of seizures.  EEGs: 06/02/13 at [facility]: Intermittent right temporal delta slowing is seen. Frequent right temporal sharp waves are seen, maximal at F8; a brief semi-periodic  run of these at 0.5 to 1Hz  occurs in the post hyperventilation period. During the recording, the patient has one electroclinical seizure with hand and mouth automatisms that arose out of drowsiness.  The EEG shows arousal from stage N1 sleep at 14:12:54, and after clinical onset, at 14:13:04 electrographic onset occurs with 7Hz  rhythmic theta frequenices in the right temporal region which quickly spread throughout the right hemisphere and then to the midline and left parasagittal derivations while slowing to 4.5Hz  rhythmic theta. The ictal rhythm then further evolved to somewhat sharply contoured delta frequencies with superimposed faster frequencies which were again more localized to the right hemisphere, maximal in the right temporal region and which slowed to around 2Hz  prior to offset at 14:14:05.  PAST MEDICAL HISTORY: Past Medical History  Diagnosis Date  . Seizures     MEDICATIONS: Zonegran 100mg  Takes 2 caps daily   No current facility-administered medications on file prior to visit.    ALLERGIES: No Known Allergies  FAMILY HISTORY: Family History  Problem Relation Age of Onset  . Cancer Maternal Grandmother     ovarian cancer     SOCIAL HISTORY: History   Social History  . Marital Status: Single    Spouse Name: N/A    Number of Children: N/A  . Years of Education: N/A   Occupational History  . Not on file.   Social History Main Topics  . Smoking status: Current Every Day Smoker -- 1.00 packs/day    Types: Cigarettes  . Smokeless tobacco: Never Used  . Alcohol Use: No     Comment: daily  . Drug Use: Yes    Special: Marijuana  . Sexual Activity: Not on file   Other Topics Concern  . Not on file   Social History Narrative    REVIEW OF SYSTEMS: Constitutional: No fevers, chills, or sweats, no generalized fatigue, change in appetite Eyes: No visual changes, double vision, eye pain Ear, nose and throat: No hearing loss, ear pain, nasal congestion, sore throat Cardiovascular: No chest pain, palpitations Respiratory:  No shortness of breath at rest or with exertion, wheezes GastrointestinaI: No nausea, vomiting, diarrhea, abdominal pain, fecal  incontinence Genitourinary:  No dysuria, urinary retention or frequency Musculoskeletal:  No neck pain, back pain Integumentary: No rash, pruritus, skin lesions Neurological: as above Psychiatric: No depression, insomnia, anxiety Endocrine: No palpitations, fatigue, diaphoresis, mood swings, change in appetite, change in weight, increased thirst Hematologic/Lymphatic:  No anemia, purpura, petechiae. Allergic/Immunologic: no itchy/runny eyes, nasal congestion, recent allergic reactions, rashes  PHYSICAL EXAM: Filed Vitals:   03/05/14 1128  BP: 98/78  Pulse: 80  Resp: 16   General: No acute distress Head:  Normocephalic/atraumatic Neck: supple, no paraspinal tenderness, full range of motion Heart:  Regular rate and rhythm Lungs:  Clear to auscultation bilaterally Back: No paraspinal tenderness Skin/Extremities: No rash, no edema Neurological Exam: alert and oriented to person, place, and time. No aphasia or dysarthria. Fund of knowledge is appropriate.  Recent and remote memory are intact.  Attention and concentration are normal.    Able to name objects and repeat phrases. Cranial nerves: Pupils equal, round, reactive to light.  Fundoscopic exam unremarkable, no papilledema. Extraocular movements intact with no nystagmus. Visual fields full. Facial sensation intact. No facial asymmetry. Tongue, uvula, palate midline.  Motor: Bulk and tone normal, muscle strength 5/5 throughout with no pronator drift.  Sensation to light touch intact.  No extinction to double simultaneous stimulation.  Deep tendon reflexes 2+ throughout, toes downgoing.  Finger  to nose testing intact.  Gait narrow-based and steady, able to tandem walk adequately.  Romberg negative.  IMPRESSION: This is a 38 yo RH man with a history of right temporal lobe epilepsy. He had been having nocturnal convulsions since age 54, and had his first seizure out of wakefulness last 01/02/14. He also reports episodes of deja vu once a month.  He had 2 seizures in December after reducing Zonegran dose to 200mg  daily due to increased irritability and weight loss. He will benefit from a mood stabilizing AED such as Lamotrigine, and will start Lamotrigine 25mg  BID for 2 weeks, with uptitration schedule. Continue Zonegran 200mg  daily for now, with plans to taper off once Lamictal dose is therapeutic. Side effects of Lamictal, including Levonne Spiller syndrome, were discussed. He is not driving. He will follow-up in 1 month and knows to call our office for any problems.  Thank you for allowing me to participate in his care.  Please do not hesitate to call for any questions or concerns.  The duration of this appointment visit was 15 minutes of face-to-face time with the patient.  Greater than 50% of this time was spent in counseling, explanation of diagnosis, planning of further management, and coordination of care.   Patrcia Dolly, M.D.   CC: Dr. Hyman Hopes

## 2014-03-05 NOTE — Telephone Encounter (Signed)
Per dr Karel Jarvisaquino cancelled appt on 04-23-14

## 2014-03-08 NOTE — Telephone Encounter (Signed)
Pt no showed 03/04/14 appt w/ DSr. Karel Jarvisquino but was seen 03/05/14. A no show letter does not need to be sent / Sherri S.

## 2014-03-10 ENCOUNTER — Encounter: Payer: Self-pay | Admitting: Neurology

## 2014-04-05 ENCOUNTER — Telehealth: Payer: Self-pay | Admitting: Neurology

## 2014-04-05 NOTE — Telephone Encounter (Signed)
Pt called to f/u on the message earlier today. Please call pt back.  C/b [phone]

## 2014-04-05 NOTE — Telephone Encounter (Signed)
Spoke with patient. He was due for a rf on his Zonegran, he wanted to make sure there were not any changes that Dr Karel JarvisAquino wanted made to his dosing. At last ov she mentioned him weaning off med, but 1st he needed to be therapeutic on Lamictal . He is due for an ov on Wed of this week. I advised to continue taking med the way he has been & this well be addressed on Wed.

## 2014-04-05 NOTE — Telephone Encounter (Signed)
Question about medication changes. Please call back # [phone] / Sherri S.

## 2014-04-07 ENCOUNTER — Ambulatory Visit: Payer: Self-pay | Admitting: Neurology

## 2014-04-09 ENCOUNTER — Telehealth: Payer: Self-pay | Admitting: Neurology

## 2014-04-09 NOTE — Telephone Encounter (Signed)
Pt called the day of to r/s his 04/07/14 appt w/ Dr. Karel JarvisAquino. Appt marked as a no show b/c he did not allow 24 hours prior approval but a no show letter was no sent / Sherri S.

## 2014-04-16 ENCOUNTER — Ambulatory Visit: Payer: Self-pay | Admitting: Neurology

## 2014-04-23 ENCOUNTER — Ambulatory Visit: Payer: Self-pay | Admitting: Neurology

## 2014-05-24 ENCOUNTER — Telehealth: Payer: Self-pay | Admitting: Neurology

## 2014-05-24 NOTE — Telephone Encounter (Signed)
Received records request from Disability Determination Services requesting records. Request forwarded to HIM by interoffice mail at [facility] / Sherri S.  °

## 2014-05-25 ENCOUNTER — Encounter: Payer: Self-pay | Admitting: Neurology

## 2014-05-25 ENCOUNTER — Ambulatory Visit (INDEPENDENT_AMBULATORY_CARE_PROVIDER_SITE_OTHER): Payer: Self-pay | Admitting: Neurology

## 2014-05-25 VITALS — BP 100/72 | HR 87 | Resp 18 | Ht 74.0 in | Wt 151.0 lb

## 2014-05-25 DIAGNOSIS — G40009 Localization-related (focal) (partial) idiopathic epilepsy and epileptic syndromes with seizures of localized onset, not intractable, without status epilepticus: Secondary | ICD-10-CM

## 2014-05-25 MED ORDER — LAMOTRIGINE 100 MG PO TABS: ORAL_TABLET | ORAL | Status: DC

## 2014-05-25 NOTE — Progress Notes (Signed)
NEUROLOGY FOLLOW UP OFFICE NOTE  Ray Hill [id number]  HISTORY OF PRESENT ILLNESS: I had the pleasure of seeing Ray Hill in follow-up in the neurology clinic on 05/25/2014.  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 He is again accompanied by his mother today.On his last visit, he reported 2 seizures in December 2015 after reducing Zonegran due to increased irritability and weight loss. He was started on Lamictal, currently on 50mg  BID with no seizures since 02/2014. He reports doing better, his mood is a little better, he is eating more. He has occasional chest tightness. He denies any headaches, dizziness, focal numbness/tingling/weakness.   HPI: This is a 39 yo RH man with a history of seizures since age 89. His mother has witnessed the seizures that occur in the early morning hours out of sleep. His mother would hear him convulsing, he would struggle to get out of the bed and be confused for 30 minutes or so, or go back to sleep. Seizures last 5-10 minutes. They were occurring approximately every 4 months. He was initially seen by Dr. Terrace ArabiaYan, records unavailable for review. [facility] was [facility] in January 2015, where he reported that alcohol consumption had increased but was unable to correlate if seizures increased around more alcohol intake. He reported chronic left shoulder pain due to falling out of the bed after a seizure a few years ago, unable to lift his arm a little more than above the shoulder. He had an MRI brain on 04/28/13 which reported a few subcortical and deep cerebral white matter punctate foci of increased T2/FLAIR signal, otherwise unremarkable. Images unavailable for review. He had a routine EEG which was abnormal, with right temporal slowing and sharp waves, as well as capturing one electroclinical seizure arising from the right temporal region with report of hand and mouth automatisms that arose out of drowsiness. He was started  on low dose Zonegran and had been taking 100mg /day. He had a nocturnal seizure on 01/01/14, then another seizure on 01/02/14 which is the first time it occurred while awake. He recalls having a sensation of deja vu 15 to 20 minutes prior the seizure, then was amnestic of the event. He denied any prior sleep deprivation and had reduced alcohol intake. [facility] where he was found to have a small amount of subarachnoid hemorrhage in the right sylvian fissure without mass effect. Repeat head CT on 10/11 noted that this was much less well seen. He was evaluated and cleared from neurosurgical standpoint. His Zonegran was increased to 200mg /day. He had another seizure while admitted to the hospital on 01/04/14 with confusion and picking at his clothing lasting 2 minutes. He denies any post-ictal focal weakness.   He reports having deja vu around once a month. He denies anyolfactory/gustatory hallucinations, rising epigastric sensation, focal numbness/tingling/weakness, myoclonic jerks. He and his mother report problems with his memory. He is not driving and is currently unemployed. His mother has noticed increased irritability, moodiness, and agitation since starting Zonegran. He denies any suicidal or homicidal ideation.  Epilepsy Risk Factors: He had a normal birth and early development. There is no history of febrile convulsions, CNS infections such as meningitis/encephalitis, significant traumatic brain injury, neurosurgical procedures, or family history of seizures.  EEGs: 06/02/13 at [facility]: Intermittent right temporal delta slowing is seen. Frequent right temporal sharp waves are seen, maximal at F8; a brief semi-periodic run of these at 0.5 to 1Hz  occurs in the  post hyperventilation period. During the recording, the patient has one electroclinical seizure with hand and mouth automatisms that arose out of drowsiness. The EEG shows arousal from stage N1 sleep at 14:12:54, and after clinical onset,  at 14:13:04 electrographic onset occurs with  rhythmic theta frequenices in the right temporal region which quickly spread throughout the right hemisphere and then to the midline and left parasagittal derivations while slowing to 4.5Hz  rhythmic theta. The ictal rhythm then further evolved to somewhat sharply contoured delta frequencies with superimposed faster frequencies which were again more localized to the right hemisphere, maximal in the right temporal region and which slowed to around  prior to offset at 14:14:05.  PAST MEDICAL HISTORY: Past Medical History  Diagnosis Date  . Seizures     MEDICATIONS: Current Outpatient Prescriptions on File Prior to Visit  Medication Sig Dispense Refill  . lamoTRIgine (LAMICTAL) 25 MG tablet Take 1 tablet twice a day for 2 weeks, then increase to 2 tablets twice a day (Patient taking differently: Take 2 tablets twice a day) 120 tablet 3  . zonisamide (ZONEGRAN) 100 MG capsule Takes 2 capsules at bedtime 60 capsule 2   No current facility-administered medications on file prior to visit.    ALLERGIES: No Known Allergies  FAMILY HISTORY: Family History  Problem Relation Age of Onset  . Cancer Maternal Grandmother     ovarian cancer     SOCIAL HISTORY: History   Social History  . Marital Status: Single    Spouse Name: N/A  . Number of Children: N/A  . Years of Education: N/A   Occupational History  . Not on file.   Social History Main Topics  . Smoking status: Current Some Day Smoker -- 1.00 packs/day    Types: Cigarettes  . Smokeless tobacco: Never Used  . Alcohol Use: Yes  . Drug Use: Yes    Special: Marijuana  . Sexual Activity: Not on file   Other Topics Concern  . Not on file   Social History Narrative    REVIEW OF SYSTEMS: Constitutional: No fevers, chills, or sweats, no generalized fatigue, change in appetite Eyes: No visual changes, double vision, eye pain Ear, nose and throat: No hearing loss, ear pain,  nasal congestion, sore throat Cardiovascular: No chest pain, palpitations Respiratory:  No shortness of breath at rest or with exertion, wheezes GastrointestinaI: No nausea, vomiting, diarrhea, abdominal pain, fecal incontinence Genitourinary:  No dysuria, urinary retention or frequency Musculoskeletal:  No neck pain, back pain Integumentary: No rash, pruritus, skin lesions Neurological: as above Psychiatric: No depression,+ insomnia, anxiety Endocrine: No palpitations, fatigue, diaphoresis, mood swings, change in appetite, change in weight, increased thirst Hematologic/Lymphatic:  No anemia, purpura, petechiae. Allergic/Immunologic: no itchy/runny eyes, nasal congestion, recent allergic reactions, rashes  PHYSICAL EXAM: Filed Vitals:   05/25/14 1408  BP: 100/72  Pulse: 87  Resp: 18   General: No acute distress Head:  Normocephalic/atraumatic Neck: supple, no paraspinal tenderness, full range of motion Heart:  Regular rate and rhythm Lungs:  Clear to auscultation bilaterally Back: No paraspinal tenderness Skin/Extremities: No rash, no edema Neurological Exam: alert and oriented to person, place, and time. No aphasia or dysarthria. Fund of knowledge is appropriate.  Recent and remote memory are intact.  Attention and concentration are normal.    Able to name objects and repeat phrases. Cranial nerves: Pupils equal, round, reactive to light.  Fundoscopic exam unremarkable, no papilledema. Extraocular movements intact with no nystagmus. Visual fields full. Facial sensation intact. No facial asymmetry.  Tongue, uvula, palate midline.  Motor: Bulk and tone normal, muscle strength 5/5 throughout with no pronator drift.  Sensation to light touch intact.  No extinction to double simultaneous stimulation.  Deep tendon reflexes 2+ throughout, toes downgoing.  Finger to nose testing intact.  Gait narrow-based and steady, able to tandem walk adequately.  Romberg negative.  IMPRESSION: This is a 39  yo RH man with a history of right temporal lobe epilepsy. He had been having nocturnal convulsions since age 39, and had his first seizure out of wakefulness last 01/02/14. He also reports episodes of deja vu once a month. Last seizure 03/01/14 after he self-reduced Zonegran due to side effects. He is now on Lamictal  BID and Zonegran  qhs. He will increase Lamictal to  BID, then start tapering off Zonegran. He does not drive and is applying for disability. He was given contact information for the [employer]. He is aware of [place] driving laws that he should not drive until 6 months seizure-free. He will follow-up in 3 months.  Thank you for allowing me to participate in his care.  Please do not hesitate to call for any questions or concerns.  The duration of this appointment visit was 15 minutes of face-to-face time with the patient.  Greater than 50% of this time was spent in counseling, explanation of diagnosis, planning of further management, and coordination of care.   Patrcia Dolly, M.D.   CC: Dr. Hyman Hopes

## 2014-05-25 NOTE — Patient Instructions (Addendum)
1. Increase Lamictal 25mg : Take 4 tablets twice a day. After you finish your bottle, your new prescription will be for Lamictal 100mg : Take 1 tablet twice a day 2. Continue Zonegran 100mg  2 capsules daily for another 3 days, then after 3 days of increasing Lamictal, lower the Zonegran to 1 capsule daily for 1 week, then stop medication 3. Contact the [employer] [phone] for support and services available for patients with seizures 4. As per [place] driving laws, one should not drive until 6 months seizure-free   Seizure Precautions: 1. If medication has been prescribed for you to prevent seizures, take it exactly as directed.  Do not stop taking the medicine without talking to your doctor first, even if you have not had a seizure in a long time.   2. Avoid activities in which a seizure would cause danger to yourself or to others.  Don't operate dangerous machinery, swim alone, or climb in high or dangerous places, such as on ladders, roofs, or girders.  Do not drive unless your doctor says you may.  3. If you have any warning that you may have a seizure, lay down in a safe place where you can't hurt yourself.    4.  No driving for 6 months from last seizure, as [place] [facility] [place] [place] [place].   Please refer to the following link on the [employer]'s website for more information: http://www.epilepsyfoundation.org/answerplace/Social/driving/drivingu.cfm   5.  Maintain good sleep hygiene.  6.  Contact your doctor if you have any problems that may be related to the medicine you are taking.  7.  Call 911 and bring the patient back to the ED if:        A.  The seizure lasts longer than 5 minutes.       B.  The patient doesn't awaken shortly after the seizure  C.  The patient has new problems such as difficulty seeing, speaking or moving  D.  The patient was injured during the seizure  E.  The patient has a temperature over 102 F (39C)  F.  The patient vomited and now is  having trouble breathing

## 2014-06-08 ENCOUNTER — Telehealth: Payer: Self-pay | Admitting: Neurology

## 2014-06-08 NOTE — Telephone Encounter (Signed)
Sent disability determination service request to medical records

## 2014-06-23 ENCOUNTER — Emergency Department (HOSPITAL_BASED_OUTPATIENT_CLINIC_OR_DEPARTMENT_OTHER)
Admission: EM | Admit: 2014-06-23 | Discharge: 2014-06-23 | Disposition: A | Discharge status: Home / Self Care | Payer: Self-pay | Attending: Emergency Medicine | Admitting: Emergency Medicine

## 2014-06-23 ENCOUNTER — Encounter (HOSPITAL_BASED_OUTPATIENT_CLINIC_OR_DEPARTMENT_OTHER): Payer: Self-pay

## 2014-06-23 DIAGNOSIS — G40909 Epilepsy, unspecified, not intractable, without status epilepticus: Secondary | ICD-10-CM | POA: Insufficient documentation

## 2014-06-23 DIAGNOSIS — Z79899 Other long term (current) drug therapy: Secondary | ICD-10-CM | POA: Insufficient documentation

## 2014-06-23 DIAGNOSIS — Z720 Tobacco use: Secondary | ICD-10-CM | POA: Insufficient documentation

## 2014-06-23 DIAGNOSIS — L0231 Cutaneous abscess of buttock: Secondary | ICD-10-CM | POA: Insufficient documentation

## 2014-06-23 MED ORDER — CEPHALEXIN 500 MG PO CAPS: 500.0000 mg | ORAL_CAPSULE | Freq: Four times a day (QID) | ORAL | Status: DC

## 2014-06-23 MED ORDER — SULFAMETHOXAZOLE-TRIMETHOPRIM 800-160 MG PO TABS: 1.0000 | ORAL_TABLET | Freq: Two times a day (BID) | ORAL | Status: DC

## 2014-06-23 MED ORDER — LIDOCAINE HCL (PF) 1 % IJ SOLN
INTRAMUSCULAR | Status: AC
  Administered 2014-06-23: 17:00:00
  Filled 2014-06-23: qty 5

## 2014-06-23 NOTE — ED Notes (Signed)
Painful area on buttock. Area is warm and tender to touch.

## 2014-06-23 NOTE — Discharge Instructions (Signed)
Keflex and Bactrim as prescribed.  Warm soaks as frequently as possible for the next 2-3 days.  Ibuprofen 600 mg every 6 hours as needed for pain.  Return to the ER if your symptoms significantly worsen or change.   Abscess An abscess is an infected area that contains a collection of pus and debris.It can occur in almost any part of the body. An abscess is also known as a furuncle or boil. CAUSES  An abscess occurs when tissue gets infected. This can occur from blockage of oil or sweat glands, infection of hair follicles, or a minor injury to the skin. As the body tries to fight the infection, pus collects in the area and creates pressure under the skin. This pressure causes pain. People with weakened immune systems have difficulty fighting infections and get certain abscesses more often.  SYMPTOMS Usually an abscess develops on the skin and becomes a painful mass that is red, warm, and tender. If the abscess forms under the skin, you may feel a moveable soft area under the skin. Some abscesses break open (rupture) on their own, but most will continue to get worse without care. The infection can spread deeper into the body and eventually into the bloodstream, causing you to feel ill.  DIAGNOSIS  Your caregiver will take your medical history and perform a physical exam. A sample of fluid may also be taken from the abscess to determine what is causing your infection. TREATMENT  Your caregiver may prescribe antibiotic medicines to fight the infection. However, taking antibiotics alone usually does not cure an abscess. Your caregiver may need to make a small cut (incision) in the abscess to drain the pus. In some cases, gauze is packed into the abscess to reduce pain and to continue draining the area. HOME CARE INSTRUCTIONS   Only take over-the-counter or prescription medicines for pain, discomfort, or fever as directed by your caregiver.  If you were prescribed antibiotics, take them as  directed. Finish them even if you start to feel better.  If gauze is used, follow your caregiver's directions for changing the gauze.  To avoid spreading the infection:  Keep your draining abscess covered with a bandage.  Wash your hands well.  Do not share personal care items, towels, or whirlpools with others.  Avoid skin contact with others.  Keep your skin and clothes clean around the abscess.  Keep all follow-up appointments as directed by your caregiver. SEEK MEDICAL CARE IF:   You have increased pain, swelling, redness, fluid drainage, or bleeding.  You have muscle aches, chills, or a general ill feeling.  You have a fever. MAKE SURE YOU:   Understand these instructions.  Will watch your condition.  Will get help right away if you are not doing well or get worse. Document Released: 12/20/2004 Document Revised: 09/11/2011 Document Reviewed: 05/25/2011 [employer] Patient Information 2015 [employer] [place]. This information is not intended to replace advice given to you by your health care provider. Make sure you discuss any questions you have with your health care provider.

## 2014-06-23 NOTE — ED Provider Notes (Signed)
CSN: [id number]     Arrival date & time 06/23/14  1620 History   First MD Initiated Contact with Patient 06/23/14 1626     Chief Complaint  Patient presents with  . Abscess     (Consider location/radiation/quality/duration/timing/severity/associated sxs/prior Treatment) Patient is a 39 y.o. male presenting with abscess. The history is provided by the patient.  Abscess Abscess location: Buttock. Abscess quality: fluctuance, induration, painful and redness   Abscess quality: not draining   Red streaking: no   Duration:  3 days Progression:  Worsening Pain details:    Quality:  Throbbing   Severity:  Moderate   Duration:  3 days   Timing:  Constant   Progression:  Worsening Chronicity:  New Context: not diabetes, not immunosuppression and not insect bite/sting     Past Medical History  Diagnosis Date  . Seizures    History reviewed. No pertinent past surgical history. Family History  Problem Relation Age of Onset  . Cancer Maternal Grandmother     ovarian cancer    History  Substance Use Topics  . Smoking status: Current Some Day Smoker -- 1.00 packs/day    Types: Cigarettes  . Smokeless tobacco: Never Used  . Alcohol Use: Yes     Comment: rarely    Review of Systems  All other systems reviewed and are negative.     Allergies  Review of patient's allergies indicates no known allergies.  Home Medications   Prior to Admission medications   Medication Sig Start Date End Date Taking? Authorizing Provider  lamoTRIgine (LAMICTAL) 100 MG tablet Take 1 tablet twice a day 05/25/14   Van ClinesKaren M Aquino, MD   BP 127/91 mmHg  Pulse 85  Temp(Src) 98.6 F (37 C) (Oral)  Resp 16  Ht 6\' 2"  (1.88 m)  Wt 160 lb (72.576 kg)  BMI 20.53 kg/m2  SpO2 99% Physical Exam  Constitutional: He is oriented to person, place, and time. He appears well-developed and well-nourished. No distress.  HENT:  Head: Normocephalic and atraumatic.  Neck: Normal range of motion. Neck supple.   Neurological: He is alert and oriented to person, place, and time.  Skin: Skin is warm and dry. He is not diaphoretic.  There is a 3 cm x 4 cm fluctuant area to the left upper buttock. There is mild surrounding erythema but no streaking.  Nursing note and vitals reviewed.   ED Course  Procedures (including critical care time) Labs Review Labs Reviewed - No data to display  Imaging Review No results found.  INCISION AND DRAINAGE Performed by: Geoffery LyonseLo, Ferman Basilio Consent: Verbal consent obtained. Risks and benefits: risks, benefits and alternatives were discussed Type: abscess  Body area: Left buttock  Anesthesia: local infiltration  Incision was made with a scalpel.  Local anesthetic: lidocaine 1 % without epinephrine  Anesthetic total: 2 ml  Complexity: complex Blunt dissection to break up loculations  Drainage: purulent  Drainage amount: Moderate   Packing material: None placed   Patient tolerance: Patient tolerated the procedure well with no immediate complications.      MDM   Final diagnoses:  None    Abscess incised and drained. We'll treat with Keflex and Bactrim and warm soaks. Return as needed for any problems.    Geoffery Lyonsouglas Bert Ptacek, MD 06/23/14 [phone]

## 2014-06-23 NOTE — ED Notes (Signed)
Left buttock abscess x 1 week

## 2014-08-31 ENCOUNTER — Ambulatory Visit (INDEPENDENT_AMBULATORY_CARE_PROVIDER_SITE_OTHER): Payer: Self-pay | Admitting: Neurology

## 2014-08-31 ENCOUNTER — Encounter: Payer: Self-pay | Admitting: Neurology

## 2014-08-31 VITALS — BP 100/74 | HR 68 | Resp 18 | Ht 74.0 in | Wt 167.0 lb

## 2014-08-31 DIAGNOSIS — G40009 Localization-related (focal) (partial) idiopathic epilepsy and epileptic syndromes with seizures of localized onset, not intractable, without status epilepticus: Secondary | ICD-10-CM

## 2014-08-31 MED ORDER — LAMOTRIGINE 100 MG PO TABS: ORAL_TABLET | ORAL | Status: DC

## 2014-08-31 NOTE — Patient Instructions (Addendum)
1. Continue Lamotrigine 100mg  twice a day 2. Bloodwork for Lamictal level 3. Contact the Epilepsy Foundation for support/resources: ciamarshal.comwww.epilepsync.org------234-725-8495 4. Follow-up in 3 months  Seizure Precautions: 1. If medication has been prescribed for you to prevent seizures, take it exactly as directed.  Do not stop taking the medicine without talking to your doctor first, even if you have not had a seizure in a long time.   2. Avoid activities in which a seizure would cause danger to yourself or to others.  Don't operate dangerous machinery, swim alone, or climb in high or dangerous places, such as on ladders, roofs, or girders.  Do not drive unless your doctor says you may.  3. If you have any warning that you may have a seizure, lay down in a safe place where you can't hurt yourself.    4.  No driving for 6 months from last seizure, as [place] [place].   Please refer to the following link on the Epile[employer] of [employer]'s website for more information: http://www.epilepsyfoundation.org/answerplace/Social/driving/drivingu.cfm   5.  Maintain good sleep hygiene.  6.  Contact your doctor if you have any problems that may be related to the medicine you are taking.  7.  Call 911 and bring the patient back to the ED if:        A.  The seizure lasts longer than 5 minutes.       B.  The patient doesn't awaken shortly after the seizure  C.  The patient has new problems such as difficulty seeing, speaking or moving  D.  The patient was injured during the seizure  E.  The patient has a temperature over 102 F (39C)  F.  The patient vomited and now is having trouble breathing

## 2014-08-31 NOTE — Progress Notes (Signed)
NEUROLOGY FOLLOW UP OFFICE NOTE  Ray MaplesRahim Kuwahara [id number]  HISTORY OF PRESENT ILLNESS: I had the pleasure of seeing Ray Hill in follow-up in the neurology clinic on 08/31/2014.  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 He is again accompanied by his mother who helps supplement the history today. Since his last visit, he has tapered off Zonegran which was causing increased irritability and weight loss. He is now on Lamotrigine 100mg  BID, with the patient and his mother reporting less irritability. He is feeling better overall on this medication, however his mother reports he is irritable today. He himself denies this. He and his mother deny any seizures since 03/01/14. They deny any staring/unresponsive episodes, gaps in time, olfactory/gustatory hallucinations, headaches, dizziness, diplopia, gait instability. He has occasional numbness in different spots on his left arm lasting a few minutes, no associated weakness.   HPI: This is a 39 yo RH man with a history of seizures since age 39. His mother has witnessed the seizures that occur in the early morning hours out of sleep. His mother would hear him convulsing, he would struggle to get out of the bed and be confused for 30 minutes or so, or go back to sleep. Seizures last 5-10 minutes. They were occurring approximately every 4 months. He was initially seen by Dr. Terrace ArabiaYan, records unavailable for review. [facility], where he reported that alcohol consumption had increased but was unable to correlate if seizures increased around more alcohol intake. He reported chronic left shoulder pain due to falling out of the bed after a seizure a few years ago, unable to lift his arm a little more than above the shoulder. He had an MRI brain on 04/28/13 which reported a few subcortical and deep cerebral white matter punctate foci of increased T2/FLAIR signal, otherwise unremarkable. Images  unavailable for review. He had a routine EEG which was abnormal, with right temporal slowing and sharp waves, as well as capturing one electroclinical seizure arising from the right temporal region with report of hand and mouth automatisms that arose out of drowsiness. He was started on low dose Zonegran and had been taking 100mg /day. He had a nocturnal seizure on 01/01/14, then another seizure on 01/02/14 which is the first time it occurred while awake. He recalls having a sensation of deja vu 15 to 20 minutes prior the seizure, then was amnestic of the event. He denied any prior sleep deprivation and had reduced alcohol intake. 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 Repeat head CT on 10/11 noted that this was much less well seen. He was evaluated and cleared from neurosurgical standpoint. His Zonegran was increased to 200mg /day. He had another seizure while admitted to the hospital on 01/04/14 with confusion and picking at his clothing lasting 2 minutes. He denies any post-ictal focal weakness.   He reports having deja vu around once a month. He denies anyolfactory/gustatory hallucinations, rising epigastric sensation, focal numbness/tingling/weakness, myoclonic jerks. He and his mother report problems with his memory. He is not driving and is currently unemployed. His mother has noticed increased irritability, moodiness, and agitation since starting Zonegran. He denies any suicidal or homicidal ideation.  Epilepsy Risk Factors: He had a normal birth and early development. There is no history of febrile convulsions, CNS infections such as meningitis/encephalitis, significant traumatic brain injury, neurosurgical procedures,  or family history of seizures.  EEGs: 06/02/13 at [facility]: Intermittent right temporal delta slowing is seen. Frequent right temporal sharp waves are seen, maximal at F8; a brief  semi-periodic run of these at 0.5 to  occurs in the post hyperventilation period. During the recording, the patient has one electroclinical seizure with hand and mouth automatisms that arose out of drowsiness. The EEG shows arousal from stage N1 sleep at 14:12:54, and after clinical onset, at 14:13:04 electrographic onset occurs with  rhythmic theta frequenices in the right temporal region which quickly spread throughout the right hemisphere and then to the midline and left parasagittal derivations while slowing to 4.5Hz  rhythmic theta. The ictal rhythm then further evolved to somewhat sharply contoured delta frequencies with superimposed faster frequencies which were again more localized to the right hemisphere, maximal in the right temporal region and which slowed to around  prior to offset at 14:14:05.  PAST MEDICAL HISTORY: Past Medical History  Diagnosis Date  . Seizures     MEDICATIONS: Current Outpatient Prescriptions on File Prior to Visit  Medication Sig Dispense Refill  . lamoTRIgine (LAMICTAL) 100 MG tablet Take 1 tablet twice a day 60 tablet 6   No current facility-administered medications on file prior to visit.    ALLERGIES: No Known Allergies  FAMILY HISTORY: Family History  Problem Relation Age of Onset  . Cancer Maternal Grandmother     ovarian cancer     SOCIAL HISTORY: History   Social History  . Marital Status: Single    Spouse Name: N/A  . Number of Children: N/A  . Years of Education: N/A   Occupational History  . Not on file.   Social History Main Topics  . Smoking status: Current Some Day Smoker -- 1.00 packs/day    Types: Cigarettes  . Smokeless tobacco: Never Used  . Alcohol Use: Yes     Comment: rarely  . Drug Use: Yes    Special: Marijuana  . Sexual Activity: Not on file   Other Topics Concern  . Not on file   Social History Narrative    REVIEW OF SYSTEMS: Constitutional: No fevers, chills, or sweats, no generalized fatigue,  change in appetite Eyes: No visual changes, double vision, eye pain Ear, nose and throat: No hearing loss, ear pain, nasal congestion, sore throat Cardiovascular: No chest pain, palpitations Respiratory:  No shortness of breath at rest or with exertion, wheezes GastrointestinaI: No nausea, vomiting, diarrhea, abdominal pain, fecal incontinence Genitourinary:  No dysuria, urinary retention or frequency Musculoskeletal:  No neck pain, back pain Integumentary: No rash, pruritus, skin lesions Neurological: as above Psychiatric: No depression, insomnia, anxiety Endocrine: No palpitations, fatigue, diaphoresis, mood swings, change in appetite, change in weight, increased thirst Hematologic/Lymphatic:  No anemia, purpura, petechiae. Allergic/Immunologic: no itchy/runny eyes, nasal congestion, recent allergic reactions, rashes  PHYSICAL EXAM: Filed Vitals:   08/31/14 1249  BP: 100/74  Pulse: 68  Resp: 18   General: No acute distress Head:  Normocephalic/atraumatic Neck: supple, no paraspinal tenderness, full range of motion Heart:  Regular rate and rhythm Lungs:  Clear to auscultation bilaterally Back: No paraspinal tenderness Skin/Extremities: No rash, no edema Neurological Exam: alert and oriented to person, place, and time. No aphasia or dysarthria. Fund of knowledge is appropriate.  Recent and remote memory are intact.  Attention and concentration are normal.    Able to name objects and repeat phrases. Cranial nerves: Pupils equal, round, reactive to light.  Fundoscopic exam unremarkable, no papilledema. Extraocular movements intact with  no nystagmus. Visual fields full. Facial sensation intact. No facial asymmetry. Tongue, uvula, palate midline.  Motor: Bulk and tone normal, muscle strength 5/5 throughout with no pronator drift.  Sensation to light touch intact.  No extinction to double simultaneous stimulation.  Deep tendon reflexes 2+ throughout, toes downgoing.  Finger to nose testing  intact.  Gait narrow-based and steady, able to tandem walk adequately.  Romberg negative.  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 He had been having nocturnal convulsions since age 89, and had his first seizure out of wakefulness last 01/02/14. He also reported episodes of deja vu once a month. He denies any further seizure or seizure-like symptoms since 03/01/14. He is tolerating Lamotrigine  BID better. Baseline Lamictal level will be checked today. We again discussed [place] driving laws that indicate that one should not drive until 6 months seizure-free, he knows to stop driving after a seizure. He was again given contact information for the Epilepsy Foundation as a resource/support. He will follow-up in 3 months and knows to call our office for any problems.  Thank you for allowing me to participate in his care.  Please do not hesitate to call for any questions or concerns.  The duration of this appointment visit was 15 minutes of face-to-face time with the patient.  Greater than 50% of this time was spent in counseling, explanation of diagnosis, planning of further management, and coordination of care.   Patrcia Dolly, M.D.   CC: Dr. Hyman Hopes

## 2014-09-02 ENCOUNTER — Telehealth: Payer: Self-pay | Admitting: Neurology

## 2014-09-02 NOTE — Telephone Encounter (Signed)
Pt needs to talk to someone about the blood work that he was suppose to get done please call [phone]

## 2014-09-02 NOTE — Telephone Encounter (Signed)
Returned patient's call. He states that when he went to have his labs drawn it was a charge of $57.00. He doesn't have insurance he wasn't able to pay that amount,so he left. I told him that he could stop by our office with his lab requisition and I would stamp labs for him to be eligible for uninsured patient pricing program.

## 2014-10-04 ENCOUNTER — Telehealth: Payer: Self-pay | Admitting: Neurology

## 2014-10-04 NOTE — Telephone Encounter (Signed)
VM-Pt's mother Damian Leavellrudy called and said she had some questions for the doctor and also about his medication but did not state what the name of the med was/Dawn CB# [phone]

## 2014-10-04 NOTE — Telephone Encounter (Signed)
I did return call to patient's mother. I explained to her that I couldn't speak with due to our office not having a release on file. I did assure her that I would call the patient and speak with him directly.   I did call the patient and inform him that his mother did call with concerns about his medication/lamictal. He did tell me that he hasn't had his medication in 2 days, but he is going to pick it up today. I did explain to him the importance of taking his medicine as prescribed everyday to prevent seizures. He also needs to have his bloodwork done, which he hasn't had done yet. He needs to come by the office so I can stamp his lab order to be eligible for the [facility] uninsured patient program. He states he is going to come by one day this week to have that done so he can go ahead and get his bloodwork done.

## 2014-12-01 ENCOUNTER — Ambulatory Visit (INDEPENDENT_AMBULATORY_CARE_PROVIDER_SITE_OTHER): Payer: Self-pay | Admitting: Neurology

## 2014-12-01 ENCOUNTER — Encounter: Payer: Self-pay | Admitting: Neurology

## 2014-12-01 VITALS — BP 104/72 | HR 79 | Resp 16 | Ht 74.0 in | Wt 160.0 lb

## 2014-12-01 DIAGNOSIS — F32A Depression, unspecified: Secondary | ICD-10-CM

## 2014-12-01 DIAGNOSIS — F329 Major depressive disorder, single episode, unspecified: Secondary | ICD-10-CM

## 2014-12-01 DIAGNOSIS — G40009 Localization-related (focal) (partial) idiopathic epilepsy and epileptic syndromes with seizures of localized onset, not intractable, without status epilepticus: Secondary | ICD-10-CM

## 2014-12-01 MED ORDER — LAMOTRIGINE 100 MG PO TABS: ORAL_TABLET | ORAL | Status: DC

## 2014-12-01 NOTE — Progress Notes (Signed)
NEUROLOGY FOLLOW UP OFFICE NOTE  Ray Hill [id number]  HISTORY OF PRESENT ILLNESS: I had the pleasure of seeing Ray Hill in follow-up in the neurology clinic on 12/01/2014.  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 He is again accompanied by his mother who helps supplement the history today. He is currently on Lamotrigine 100mg  BID, with no significant side effects. He had irritability and weight loss on Zonegran. He denies any convulsive seizures since 03/01/14. He reports having a weird deja vu feeling lasting 5 minutes occurring every couple of weeks. He reports things he was thinking of a long time ago would come back. His mother denies any staring/unresponsive episodes. He denies any gaps in time, olfactory/gustatory hallucinations, headaches, dizziness, diplopia, gait instability. He usually goes to bed at 2-3am and wakes up at 10am, but wakes up multiple times at night. His mother reports he is very moody. 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 He does not drive.   HPI: This is a 39 yo RH man with a history of seizures since age 19. His mother has witnessed the seizures that occur in the early morning hours out of sleep. His mother would hear him convulsing, he would struggle to get out of the bed and be confused for 30 minutes or so, or go back to sleep. Seizures last 5-10 minutes. They were occurring approximately every 4 months. He was initially seen by Dr. Terrace Arabia, records unavailable for review. He was evaluated at [facility] in January 2015, where he reported that alcohol consumption had increased but was unable to correlate if seizures increased around more alcohol intake. He reported chronic left shoulder pain due to falling out of the bed after a seizure a few years ago, unable to lift his arm a little more than above the shoulder. He had an MRI brain on 04/28/13 which reported a few subcortical and deep cerebral white  matter punctate foci of increased T2/FLAIR signal, otherwise unremarkable. Images unavailable for review. He had a routine EEG which was abnormal, with right temporal slowing and sharp waves, as well as capturing one electroclinical seizure arising from the right temporal region with report of hand and mouth automatisms that arose out of drowsiness. He was started on low dose Zonegran and had been taking 100mg /day. He had a nocturnal seizure on 01/01/14, then another seizure on 01/02/14 which is the first time it occurred while awake. He recalls having a sensation of deja vu 15 to 20 minutes prior the seizure, then was amnestic of the event. He denied any prior sleep deprivation and had reduced alcohol intake. He was brought to [facility] where he was found to have a small amount of subarachnoid hemorrhage in the right sylvian fissure without mass effect. Repeat head CT on 10/11 noted that this was much less well seen. He was evaluated and cleared from neurosurgical standpoint. His Zonegran was increased to 200mg /day. He had another seizure while admitted to the hospital on 01/04/14 with confusion and picking at his clothing lasting 2 minutes. He denies any post-ictal focal weakness.   He reports having deja vu around once a month. He denies anyolfactory/gustatory hallucinations, rising epigastric sensation, focal numbness/tingling/weakness, myoclonic jerks. He and his mother report problems with his memory. He is not driving and is currently unemployed. His mother has noticed increased irritability, moodiness, and agitation since starting Zonegran. He denies any suicidal or homicidal ideation.  Epilepsy Risk Factors: He had a normal  birth and early development. There is no history of febrile convulsions, CNS infections such as meningitis/encephalitis, significant traumatic brain injury, neurosurgical procedures, or family history of seizures.  EEGs: 06/02/13 at [facility]: Intermittent right temporal delta slowing is  seen. Frequent right temporal sharp waves are seen, maximal at F8; a brief semi-periodic run of these at 0.5 to 1Hz  occurs in the post hyperventilation period. During the recording, the patient has one electroclinical seizure with hand and mouth automatisms that arose out of drowsiness. The EEG shows arousal from stage N1 sleep at 14:12:54, and after clinical onset, at 14:13:04 electrographic onset occurs with 7Hz  rhythmic theta frequenices in the right temporal region which quickly spread throughout the right hemisphere and then to the midline and left parasagittal derivations while slowing to 4.5Hz  rhythmic theta. The ictal rhythm then further evolved to somewhat sharply contoured delta frequencies with superimposed faster frequencies which were again more localized to the right hemisphere, maximal in the right temporal region and which slowed to around 2Hz  prior to offset at 14:14:05.  PAST MEDICAL HISTORY: Past Medical History  Diagnosis Date  . Seizures     MEDICATIONS: Current Outpatient Prescriptions on File Prior to Visit  Medication Sig Dispense Refill  . lamoTRIgine (LAMICTAL) 100 MG tablet Take 1 tablet twice a day 60 tablet 11   No current facility-administered medications on file prior to visit.    ALLERGIES: No Known Allergies  FAMILY HISTORY: Family History  Problem Relation Age of Onset  . Cancer Maternal Grandmother     ovarian cancer     SOCIAL HISTORY: Social History   Social History  . Marital Status: Single    Spouse Name: N/A  . Number of Children: N/A  . Years of Education: N/A   Occupational History  . Not on file.   Social History Main Topics  . Smoking status: Current Some Day Smoker -- 1.00 packs/day    Types: Cigarettes  . Smokeless tobacco: Never Used  . Alcohol Use: Yes     Comment: rarely  . Drug Use: Yes    Special: Marijuana  . Sexual Activity: Not on file   Other Topics Concern  . Not on file   Social History Narrative    REVIEW  OF SYSTEMS: Constitutional: No fevers, chills, or sweats, no generalized fatigue, change in appetite Eyes: No visual changes, double vision, eye pain Ear, nose and throat: No hearing loss, ear pain, nasal congestion, sore throat Cardiovascular: No chest pain, palpitations Respiratory:  No shortness of breath at rest or with exertion, wheezes GastrointestinaI: No nausea, vomiting, diarrhea, abdominal pain, fecal incontinence Genitourinary:  No dysuria, urinary retention or frequency Musculoskeletal:  No neck pain, back pain Integumentary: No rash, pruritus, skin lesions Neurological: as above Psychiatric: No depression, insomnia, anxiety Endocrine: No palpitations, fatigue, diaphoresis, mood swings, change in appetite, change in weight, increased thirst Hematologic/Lymphatic:  No anemia, purpura, petechiae. Allergic/Immunologic: no itchy/runny eyes, nasal congestion, recent allergic reactions, rashes  PHYSICAL EXAM: Filed Vitals:   12/01/14 1045  BP: 104/72  Pulse: 79  Resp: 16   General: No acute distress Head:  Normocephalic/atraumatic Neck: supple, no paraspinal tenderness, full range of motion Heart:  Regular rate and rhythm Lungs:  Clear to auscultation bilaterally Back: No paraspinal tenderness Skin/Extremities: No rash, no edema Neurological Exam: alert and oriented to person, place, and time. No aphasia or dysarthria. Fund of knowledge is appropriate.  Recent and remote memory are intact. 2/3 delayed recall. Attention and concentration are normal.  Able to name objects and repeat phrases. Cranial nerves: Pupils equal, round, reactive to light.  Fundoscopic exam unremarkable, no papilledema. Extraocular movements intact with no nystagmus. Visual fields full. Facial sensation intact. No facial asymmetry. Tongue, uvula, palate midline.  Motor: Bulk and tone normal, muscle strength 5/5 throughout with no pronator drift.  Sensation to light touch intact.  No extinction to double  simultaneous stimulation.  Deep tendon reflexes 2+ throughout, toes downgoing.  Finger to nose testing intact.  Gait narrow-based and steady, able to tandem walk adequately.  Romberg negative.  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 He had been having nocturnal convulsions since age 30, and had his first seizure out of wakefulness last 01/02/14. No convulsions since 03/01/14, however he continues to have deja vu episodes concerning for simple partial seizures. He will increase Lamotrigine to  BID and have a Lamictal level done next week. His mother again expressed concern about moodiness, he will be referred to Behavioral Medicine for depression. We again discussed avoidance of seizure triggers, including missing medications, alcohol, and sleep deprivation. He will try Melatonin for sleep. He is aware of [place] driving laws that indicate that one should not drive until 6 months seizure-free, he knows to stop driving after a seizure. He will keep a calendar of his seizures and follow-up in 4 months. He knows to call our office for any problems.  Thank you for allowing me to participate in his care.  Please do not hesitate to call for any questions or concerns.  The duration of this appointment visit was 15 minutes of face-to-face time with the patient.  Greater than 50% of this time was spent in counseling, explanation of diagnosis, planning of further management, and coordination of care.   Patrcia Dolly, M.D.   CC: Dr. Hyman Hopes

## 2014-12-01 NOTE — Patient Instructions (Addendum)
1. Increase Lamotrigine : Take 1 & 1/2 tablets twice a day 2. Bloodwork for Lamictal level next week 3. Keep a calendar of your symptoms 4. Look into Melatonin  at bedtime to help with sleep 5. Refer to Behavioral Medicine for mood issues/Contact information given for [facility] 6. Contact the Epile[employer] Foundation for resources on employment opportunities for patients with epilepsy  Seizure Precautions: 1. If medication has been prescribed for you to prevent seizures, take it exactly as directed.  Do not stop taking the medicine without talking to your doctor first, even if you have not had a seizure in a long time.   2. Avoid activities in which a seizure would cause danger to yourself or to others.  Don't operate dangerous machinery, swim alone, or climb in high or dangerous places, such as on ladders, roofs, or girders.  Do not drive unless your doctor says you may.  3. If you have any warning that you may have a seizure, lay down in a safe place where you can't hurt yourself.    4.  No driving for 6 months from last seizure, as per [facility].   Please refer to the following link on the [employer]ile[employer] of [employer]'s website for more information: http://www.epilepsyfoundation.org/answerplace/Social/driving/drivingu.cfm   5.  Maintain good sleep hygiene. Avoid alcohol  6.  Contact your doctor if you have any problems that may be related to the medicine you are taking.  7.  Call 911 and bring the patient back to the ED if:        A.  The seizure lasts longer than 5 minutes.       B.  The patient doesn't awaken shortly after the seizure  C.  The patient has new problems such as difficulty seeing, speaking or moving  D.  The patient was injured during the seizure  E.  The patient has a temperature over 102 F (39C)  F.  The patient vomited and now is having trouble breathing

## 2014-12-14 ENCOUNTER — Other Ambulatory Visit: Payer: Self-pay

## 2014-12-14 DIAGNOSIS — G40009 Localization-related (focal) (partial) idiopathic epilepsy and epileptic syndromes with seizures of localized onset, not intractable, without status epilepticus: Secondary | ICD-10-CM

## 2014-12-16 LAB — LAMOTRIGINE LEVEL: LAMOTRIGINE LVL: 9.7 ug/mL (ref 4.0–18.0)

## 2014-12-17 ENCOUNTER — Telehealth: Payer: Self-pay | Admitting: Family Medicine

## 2014-12-17 NOTE — Telephone Encounter (Signed)
-----   Message from Van Clines, MD sent at 12/17/2014 10:10 AM EDT ----- Pls let him know bloodwork looks good, continue same dose Lamictal. Thanks

## 2014-12-17 NOTE — Telephone Encounter (Signed)
Lmovm to rtn my call. 

## 2014-12-20 NOTE — Telephone Encounter (Signed)
Pt returning your call

## 2014-12-20 NOTE — Telephone Encounter (Signed)
I spoke with patient and notified him of result & advisement.

## 2015-02-01 ENCOUNTER — Telehealth: Payer: Self-pay | Admitting: Neurology

## 2015-02-01 NOTE — Telephone Encounter (Signed)
I spoke with him. He states that since starting the increase of Lamictal 1 & 1/2 tablets twice daily he has no appetitie and he is losing weight. States that even when he tries to eat he can't get much in. Wants to go back on original dose. Please advise.

## 2015-02-01 NOTE — Telephone Encounter (Signed)
PT called in regards to his medication Lamotrigine/Dawn CB# [phone]

## 2015-02-01 NOTE — Telephone Encounter (Signed)
Would try 1 tablet in AM, 1 & 1/2 tablet at night for now and see if that helps with appetite during daytime. Thanks

## 2015-02-01 NOTE — Telephone Encounter (Signed)
Spoke with patient and notified of advisement. He will give that a try, I did advise him to call back if he is still having issues.

## 2015-04-12 ENCOUNTER — Ambulatory Visit: Payer: Self-pay | Admitting: Neurology

## 2015-04-14 ENCOUNTER — Encounter: Payer: Self-pay | Admitting: Neurology

## 2015-04-14 ENCOUNTER — Ambulatory Visit (INDEPENDENT_AMBULATORY_CARE_PROVIDER_SITE_OTHER): Payer: Self-pay | Admitting: Neurology

## 2015-04-14 VITALS — BP 120/82 | HR 82 | Ht 74.0 in | Wt 155.0 lb

## 2015-04-14 DIAGNOSIS — G40009 Localization-related (focal) (partial) idiopathic epilepsy and epileptic syndromes with seizures of localized onset, not intractable, without status epilepticus: Secondary | ICD-10-CM

## 2015-04-14 MED ORDER — LAMOTRIGINE 100 MG PO TABS: ORAL_TABLET | ORAL | Status: DC

## 2015-04-14 NOTE — Patient Instructions (Signed)
1. Continue Lamotrigine : Take 1 tablet in AM, 1 & 1/2 tablets in PM 2. Follow-up in 8 months, call for any changes  Seizure Precautions: 1. If medication has been prescribed for you to prevent seizures, take it exactly as directed.  Do not stop taking the medicine without talking to your doctor first, even if you have not had a seizure in a long time.   2. Avoid activities in which a seizure would cause danger to yourself or to others.  Don't operate dangerous machinery, swim alone, or climb in high or dangerous places, such as on ladders, roofs, or girders.  Do not drive unless your doctor says you may.  3. If you have any warning that you may have a seizure, lay down in a safe place where you can't hurt yourself.    4.  No driving for 6 months from last seizure, as per [facility].   Please refer to the following link on the Epilepsy Foundation of America's website for more information: http://www.epilepsyfoundation.org/answerplace/Social/driving/drivingu.cfm   5.  Maintain good sleep hygiene. Avoid alcohol.  6.  Contact your doctor if you have any problems that may be related to the medicine you are taking.  7.  Call 911 and bring the patient back to the ED if:        A.  The seizure lasts longer than 5 minutes.       B.  The patient doesn't awaken shortly after the seizure  C.  The patient has new problems such as difficulty seeing, speaking or moving  D.  The patient was injured during the seizure  E.  The patient has a temperature over 102 F (39C)  F.  The patient vomited and now is having trouble breathing

## 2015-04-14 NOTE — Progress Notes (Signed)
NEUROLOGY FOLLOW UP OFFICE NOTE  Ray Hill [id number]  HISTORY OF PRESENT ILLNESS: I had the pleasure of seeing Ray Hill in follow-up in the neurology clinic on 04/14/2015.  The patient was last seen 4 months ago for right temporal lobe epilepsy. On his last visit, he was reporting deja vu feeling every couple of weeks. Lamotrigine dose was increased to  BID, but he called to report appetite issues. He is currently on Lamotrigine  in AM,  in PM. He reports deja vu sensations around twice a month, no associated confusion. He denies any seizures since 03/01/14. He denies any gaps in time, olfactory/gustatory hallucinations, headaches, dizziness, diplopia, gait instability. He reports sleep is better. He also reports mood is better, he did not see Behavioral Medicine as previously discussed, reporting his situation is better. He is back to working and driving.   Lamictal level 12/14/14: 9.7  HPI: This is a 40 yo RH man with a history of seizures since age 29. His mother has witnessed the seizures that occur in the early morning hours out of sleep. His mother would hear him convulsing, he would struggle to get out of the bed and be confused for 30 minutes or so, or go back to sleep. Seizures last 5-10 minutes. They were occurring approximately every 4 months. He was initially seen by Dr. Terrace Arabia, records unavailable for review. He was evaluated at [facility] in January 2015, where he reported that alcohol consumption had increased but was unable to correlate if seizures increased around more alcohol intake. He reported chronic left shoulder pain due to falling out of the bed after a seizure a few years ago, unable to lift his arm a little more than above the shoulder. He had an MRI brain on 04/28/13 which reported a few subcortical and deep cerebral white matter punctate foci of increased T2/FLAIR signal, otherwise unremarkable. Images unavailable for review. He had a routine EEG which  was abnormal, with right temporal slowing and sharp waves, as well as capturing one electroclinical seizure arising from the right temporal region with report of hand and mouth automatisms that arose out of drowsiness. He was started on low dose Zonegran and had been taking /day. He had a nocturnal seizure on 01/01/14, then another seizure on 01/02/14 which is the first time it occurred while awake. He recalls having a sensation of deja vu 15 to 20 minutes prior the seizure, then was amnestic of the event. He denied any prior sleep deprivation and had reduced alcohol intake. He was brought to [facility] where he was found to have a small amount of subarachnoid hemorrhage in the right sylvian fissure without mass effect. Repeat head CT on 10/11 noted that this was much less well seen. He was evaluated and cleared from neurosurgical standpoint. His Zonegran was increased to /day. He had another seizure while admitted to the hospital on 01/04/14 with confusion and picking at his clothing lasting 2 minutes. He denies any post-ictal focal weakness.   He reports having deja vu around once a month. He denies anyolfactory/gustatory hallucinations, rising epigastric sensation, focal numbness/tingling/weakness, myoclonic jerks. He and his mother report problems with his memory. He is not driving and is currently unemployed. His mother has noticed increased irritability, moodiness, and agitation since starting Zonegran. He denies any suicidal or homicidal ideation.  Epilepsy Risk Factors: He had a normal birth and early development. There is no history of febrile convulsions, CNS infections such as meningitis/encephalitis, significant traumatic brain injury, neurosurgical procedures, or  family history of seizures.  EEGs: 06/02/13 at [facility]: Intermittent right temporal delta slowing is seen. Frequent right temporal sharp waves are seen, maximal at F8; a brief semi-periodic run of these at 0.5 to  occurs in the post  hyperventilation period. During the recording, the patient has one electroclinical seizure with hand and mouth automatisms that arose out of drowsiness. The EEG shows arousal from stage N1 sleep at 14:12:54, and after clinical onset, at 14:13:04 electrographic onset occurs with  rhythmic theta frequenices in the right temporal region which quickly spread throughout the right hemisphere and then to the midline and left parasagittal derivations while slowing to 4.5Hz  rhythmic theta. The ictal rhythm then further evolved to somewhat sharply contoured delta frequencies with superimposed faster frequencies which were again more localized to the right hemisphere, maximal in the right temporal region and which slowed to around  prior to offset at 14:14:05.  PAST MEDICAL HISTORY: Past Medical History  Diagnosis Date  . Seizures (HCC)     MEDICATIONS: Current Outpatient Prescriptions on File Prior to Visit  Medication Sig Dispense Refill  . lamoTRIgine (LAMICTAL) 100 MG tablet Take 1 & 1/2 tablets twice a day (Patient taking differently: Take 1 tablet every morning and 1 & 1/2 tablets every night) 90 tablet 11   No current facility-administered medications on file prior to visit.    ALLERGIES: No Known Allergies  FAMILY HISTORY: Family History  Problem Relation Age of Onset  . Cancer Maternal Grandmother     ovarian cancer     SOCIAL HISTORY: Social History   Social History  . Marital Status: Single    Spouse Name: N/A  . Number of Children: N/A  . Years of Education: N/A   Occupational History  . Not on file.   Social History Main Topics  . Smoking status: Current Every Day Smoker -- 1.00 packs/day    Types: Cigarettes  . Smokeless tobacco: Never Used  . Alcohol Use: 0.0 oz/week    0 Standard drinks or equivalent per week     Comment: rarely  . Drug Use: Yes    Special: Marijuana  . Sexual Activity: Not on file   Other Topics Concern  . Not on file   Social History  Narrative    REVIEW OF SYSTEMS: Constitutional: No fevers, chills, or sweats, no generalized fatigue, change in appetite Eyes: No visual changes, double vision, eye pain Ear, nose and throat: No hearing loss, ear pain, nasal congestion, sore throat Cardiovascular: No chest pain, palpitations Respiratory:  No shortness of breath at rest or with exertion, wheezes GastrointestinaI: No nausea, vomiting, diarrhea, abdominal pain, fecal incontinence Genitourinary:  No dysuria, urinary retention or frequency Musculoskeletal:  No neck pain, back pain Integumentary: No rash, pruritus, skin lesions Neurological: as above Psychiatric: No depression, insomnia, anxiety Endocrine: No palpitations, fatigue, diaphoresis, mood swings, change in appetite, change in weight, increased thirst Hematologic/Lymphatic:  No anemia, purpura, petechiae. Allergic/Immunologic: no itchy/runny eyes, nasal congestion, recent allergic reactions, rashes  PHYSICAL EXAM: Filed Vitals:   04/14/15 1013  BP: 120/82  Pulse: 82   General: No acute distress Head:  Normocephalic/atraumatic Neck: supple, no paraspinal tenderness, full range of motion Heart:  Regular rate and rhythm Lungs:  Clear to auscultation bilaterally Back: No paraspinal tenderness Skin/Extremities: No rash, no edema Neurological Exam: alert and oriented to person, place, and time. No aphasia or dysarthria. Fund of knowledge is appropriate.  Recent and remote memory are intact. 2/3 delayed recall. Attention and concentration are normal.  Able to name objects and repeat phrases. Cranial nerves: Pupils equal, round, reactive to light. Extraocular movements intact with no nystagmus. Visual fields full. Facial sensation intact. No facial asymmetry. Tongue, uvula, palate midline.  Motor: Bulk and tone normal, muscle strength 5/5 throughout with no pronator drift.  Sensation to light touch intact.  No extinction to double simultaneous stimulation. Finger to  nose testing intact.  Gait narrow-based and steady, able to tandem walk adequately.  Romberg negative.  IMPRESSION: This is a 40 yo RH man with right temporal lobe epilepsy. He had been having nocturnal convulsions since age 40, and had his first seizure out of wakefulness last 01/02/14. No convulsions since 03/01/14. He is currently on Lamotrigine  in AM,  in PM. He reports deja vu episodes around twice a month. We discussed the option of again trying to increase Lamotrigine, but he is concerned about weight loss. Continue to monitor symptoms for now, if episodes increase in frequency, would do a retrial of  BID and monitor appetite. He reports mood is better. He is back to driving. He is aware of [place] driving laws that indicate that one should not drive until 6 months seizure-free, he knows to stop driving after a seizure. He will keep a calendar of his seizures and follow-up in 8 months. He knows to call our office for any problems.  Thank you for allowing me to participate in his care.  Please do not hesitate to call for any questions or concerns.  The duration of this appointment visit was 25 minutes of face-to-face time with the patient.  Greater than 50% of this time was spent in counseling, explanation of diagnosis, planning of further management, and coordination of care.   Patrcia Dolly, M.D.   CC: Dr. Hyman Hopes

## 2015-04-27 IMAGING — CR DG CHEST 2V
2 series · 2 of 2 positions shown · non-contrast
Comparison: None.

CLINICAL DATA: Status post fall backwards, hitting his head. Grand
mal seizure.

EXAM:
CHEST  2 VIEW

[w chest lat]
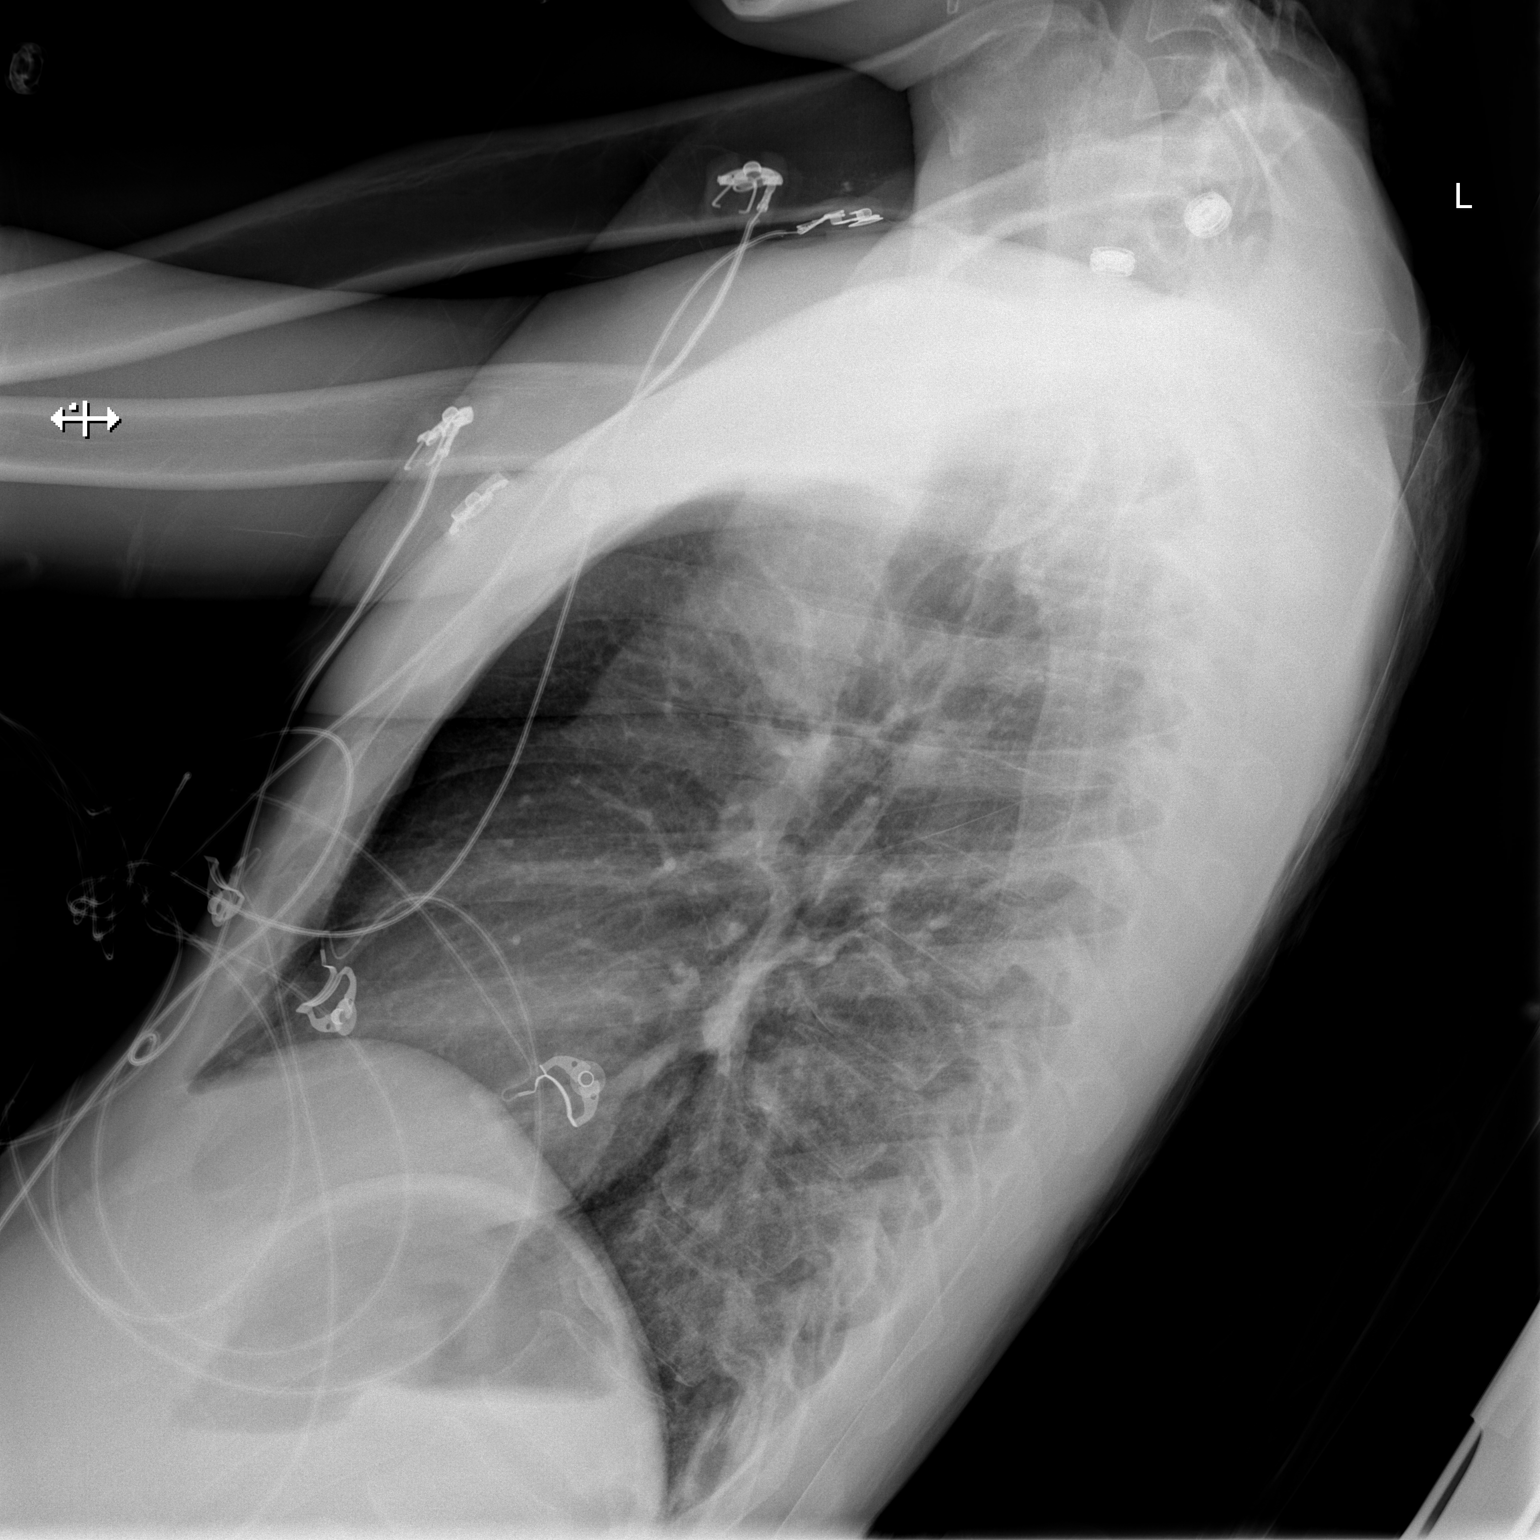

[x chest ap]
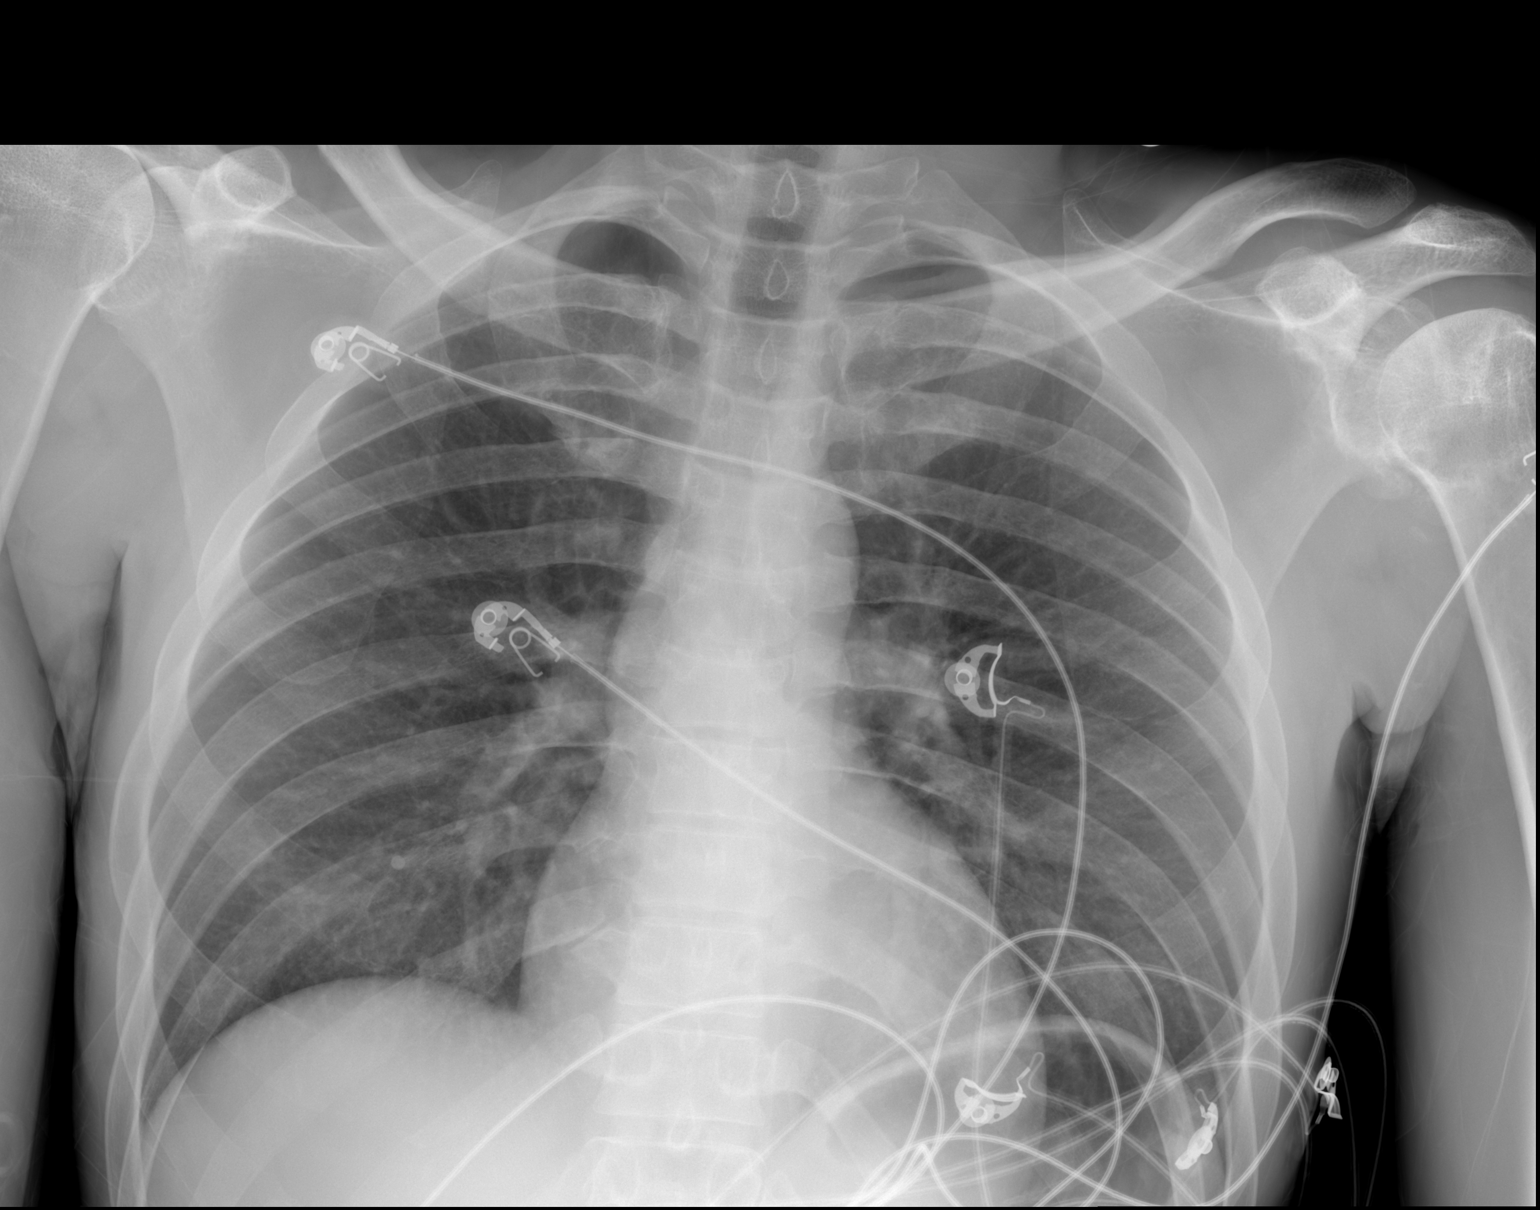

[2 of 2 positions shown; findings below may reference images not displayed]

FINDINGS: The heart size and mediastinal contours are within normal limits.
There is no focal infiltrate, pulmonary edema, or pleural effusion.
There are chronic changes of the proximal left humerus. No acute
fracture or dislocation is seen.
IMPRESSION: No active cardiopulmonary disease.

## 2015-06-28 ENCOUNTER — Telehealth: Payer: Self-pay | Admitting: Neurology

## 2015-06-28 DIAGNOSIS — G40009 Localization-related (focal) (partial) idiopathic epilepsy and epileptic syndromes with seizures of localized onset, not intractable, without status epilepticus: Secondary | ICD-10-CM

## 2015-06-28 MED ORDER — LAMOTRIGINE 100 MG PO TABS: ORAL_TABLET | ORAL | Status: DC

## 2015-06-28 NOTE — Telephone Encounter (Signed)
Spoke with patient. He wants lamictal rx changed to [facility] [address].

## 2015-06-28 NOTE — Telephone Encounter (Signed)
Patient Ray Hill [birth year] called and wanted his medication called in at another Pharmacy Location. His number is [phone]. Thank you

## 2015-08-25 ENCOUNTER — Encounter: Payer: Self-pay | Admitting: Family Medicine

## 2015-12-15 ENCOUNTER — Ambulatory Visit: Payer: Self-pay | Admitting: Neurology

## 2015-12-19 ENCOUNTER — Encounter: Payer: Self-pay | Admitting: Neurology

## 2015-12-19 ENCOUNTER — Ambulatory Visit (INDEPENDENT_AMBULATORY_CARE_PROVIDER_SITE_OTHER): Payer: Self-pay | Admitting: Neurology

## 2015-12-19 VITALS — BP 130/82 | HR 73 | Temp 98.3°F | Ht 74.0 in | Wt 156.2 lb

## 2015-12-19 DIAGNOSIS — G40009 Localization-related (focal) (partial) idiopathic epilepsy and epileptic syndromes with seizures of localized onset, not intractable, without status epilepticus: Secondary | ICD-10-CM

## 2015-12-19 MED ORDER — LAMOTRIGINE 100 MG PO TABS: ORAL_TABLET | ORAL | 11 refills | Status: DC

## 2015-12-19 NOTE — Progress Notes (Signed)
NEUROLOGY FOLLOW UP OFFICE NOTE  Ray Hill [id number]  HISTORY OF PRESENT ILLNESS: I had the pleasure of seeing Ray Hill in follow-up in the neurology clinic on 12/19/2015.  The patient was last seen 9 moVenora Hill ago for right temporal lobe epilepsy. Since his last visit, he reports only 2 episodes of deja vu in March 2017. He woke up Fri or Saturday morning with urinary incontinence, but was unsure if he had a seizure. He did not have a tongue bite or feel strange or sore as he typically does after a seizure. He may have had alcohol that night, but states he rarely drinks alcoholic beverages. He denies any sleep deprivation. He was hesitant to increase Lamotrigine back to 150mg  BID due to loss of appetite, and continues on Lamotrigine 100mg  in AM, 150mg  in PM with no side effects. He denies any headaches, dizziness, diplopia, focal numbness/tingling/weakness, olfactory/gustatory hallucinations, myoclonic jerks. He occasionally notices the veins in his left arm would bulge out, no pain. He still has some difficulty raising his left arm up since one of his seizures.   Lamictal level 12/14/14: 9.7  HPI: This is a 40 yo RH man with a history of seizures since age 40. His mother has witnessed the seizures that occur in the early morning hours out of sleep. His mother would hear him convulsing, he would struggle to get out of the bed and be confused for 30 minutes or so, or go back to sleep. Seizures last 5-10 minutes. They were occurring approximately every 4 months. He was initially seen by Dr. Terrace ArabiaYan, records unavailable for review. [facility], where he reported that alcohol consumption had increased but was unable to correlate if seizures increased around more alcohol intake. He reported chronic left shoulder pain due to falling out of the bed after a seizure a few years ago, unable to lift his arm a little more than above the shoulder. He had an MRI brain on  04/28/13 which reported a few subcortical and deep cerebral white matter punctate foci of increased T2/FLAIR signal, otherwise unremarkable. Images unavailable for review. He had a routine EEG which was abnormal, with right temporal slowing and sharp waves, as well as capturing one electroclinical seizure arising from the right temporal region with report of hand and mouth automatisms that arose out of drowsiness. He was started on low dose Zonegran and had been taking 100mg /day. He had a nocturnal seizure on 01/01/14, then another seizure on 01/02/14 which is the first time it occurred while awake. He recalls having a sensation of deja vu 15 to 20 minutes prior the seizure, then was amnestic of the event. He denied any prior sleep deprivation and had reduced alcohol intake. [facility] where he was found to have a small amount of subarachnoid hemorrhage in the right sylvian fissure without mass effect. Repeat head CT on 10/11 noted that this was much less well seen. He was evaluated and cleared from neurosurgical standpoint. His Zonegran was increased to 200mg /day. He had another seizure while admitted to the hospital on 01/04/14 with confusion and picking at his clothing lasting 2 minutes. He denies any post-ictal focal weakness.   He reports having deja vu around once a month. He denies anyolfactory/gustatory hallucinations, rising epigastric sensation, focal numbness/tingling/weakness, myoclonic jerks. He and his mother report problems with his memory. He is not driving and is currently unemployed. His mother has noticed increased irritability, moodiness, and agitation since starting  Zonegran. He denies any suicidal or homicidal ideation.  Epilepsy Risk Factors: He had a normal birth and early development. There is no history of febrile convulsions, CNS infections such as meningitis/encephalitis, significant traumatic brain injury, neurosurgical procedures, or family history of  seizures.  EEGs: 06/02/13 at [facility]: Intermittent right temporal delta slowing is seen. Frequent right temporal sharp waves are seen, maximal at F8; a brief semi-periodic run of these at 0.5 to 1Hz  occurs in the post hyperventilation period. During the recording, the patient has one electroclinical seizure with hand and mouth automatisms that arose out of drowsiness. The EEG shows arousal from stage N1 sleep at 14:12:54, and after clinical onset, at 14:13:04 electrographic onset occurs with 7Hz  rhythmic theta frequenices in the right temporal region which quickly spread throughout the right hemisphere and then to the midline and left parasagittal derivations while slowing to 4.5Hz  rhythmic theta. The ictal rhythm then further evolved to somewhat sharply contoured delta frequencies with superimposed faster frequencies which were again more localized to the right hemisphere, maximal in the right temporal region and which slowed to around 2Hz  prior to offset at 14:14:05.  PAST MEDICAL HISTORY: Past Medical History:  Diagnosis Date  . Seizures (HCC)     MEDICATIONS: Current Outpatient Prescriptions on File Prior to Visit  Medication Sig Dispense Refill  . lamoTRIgine (LAMICTAL) 100 MG tablet Take 1 tablet in AM, 1 & 1/2 tablets in PM 75 tablet 11   No current facility-administered medications on file prior to visit.     ALLERGIES: No Known Allergies  FAMILY HISTORY: Family History  Problem Relation Age of Onset  . Cancer Maternal Grandmother     ovarian cancer     SOCIAL HISTORY: Social History   Social History  . Marital status: Single    Spouse name: N/A  . Number of children: N/A  . Years of education: N/A   Occupational History  . Not on file.   Social History Main Topics  . Smoking status: Current Every Day Smoker    Packs/day: 1.00    Types: Cigarettes  . Smokeless tobacco: Never Used  . Alcohol use 0.0 oz/week     Comment: rarely  . Drug use:     Types: Marijuana  .  Sexual activity: Not on file   Other Topics Concern  . Not on file   Social History Narrative  . No narrative on file    REVIEW OF SYSTEMS: Constitutional: No fevers, chills, or sweats, no generalized fatigue, change in appetite Eyes: No visual changes, double vision, eye pain Ear, nose and throat: No hearing loss, ear pain, nasal congestion, sore throat Cardiovascular: No chest pain, palpitations Respiratory:  No shortness of breath at rest or with exertion, wheezes GastrointestinaI: No nausea, vomiting, diarrhea, abdominal pain, fecal incontinence Genitourinary:  No dysuria, urinary retention or frequency Musculoskeletal:  No neck pain, back pain Integumentary: No rash, pruritus, skin lesions Neurological: as above Psychiatric: No depression, insomnia, anxiety Endocrine: No palpitations, fatigue, diaphoresis, mood swings, change in appetite, change in weight, increased thirst Hematologic/Lymphatic:  No anemia, purpura, petechiae. Allergic/Immunologic: no itchy/runny eyes, nasal congestion, recent allergic reactions, rashes  PHYSICAL EXAM: Vitals:   12/19/15 1342  BP: 130/82  Pulse: 73  Temp: 98.3 F (36.8 C)   General: No acute distress Head:  Normocephalic/atraumatic Neck: supple, no paraspinal tenderness, full range of motion Heart:  Regular rate and rhythm Lungs:  Clear to auscultation bilaterally Back: No paraspinal tenderness Skin/Extremities: No rash, no edema Neurological Exam: alert  and oriented to person, place, and time. No aphasia or dysarthria. Fund of knowledge is appropriate.  Recent and remote memory are intact. 2/3 delayed recall. Attention and concentration are normal.    Able to name objects and repeat phrases. Cranial nerves: Pupils equal, round, reactive to light. Extraocular movements intact with no nystagmus. Visual fields full. Facial sensation intact. No facial asymmetry. Tongue, uvula, palate midline.  Motor: Bulk and tone normal, muscle strength 5/5  throughout with no pronator drift.  Sensation to light touch intact.  No extinction to double simultaneous stimulation. Finger to nose testing intact.  Gait narrow-based and steady, able to tandem walk adequately.  Romberg negative.  IMPRESSION: This is a 41 yo RH man with right temporal lobe epilepsy. He had been having nocturnal convulsions since age 38, and had his first seizure out of wakefulness last 01/02/14. No convulsions since 03/01/14. He is currently on Lamotrigine 100mg  in AM, 150mg  in PM. He only had 2 episodes of deja vu in the past 9 months. He woke up with urinary incontinence 2-3 days ago, unsure if he had a seizure, as he did not feel strange or sore upon awakening. He does not want to increase Lamotrigine at this time, continue 100mg  in AM, 150mg  in PM. He will continue to monitor symptoms, we discussed that if similar symptoms recur, would either increase Lamotrigine back up to 150mg  BID or add on another medication. He is driving and is aware of [place] driving laws that indicate that one should not drive until 6 months seizure-free, he knows to stop driving after a seizure. He will keep a calendar of his seizures and follow-up in 8 months. He knows to call our office for any problems.  Thank you for allowing me to participate in his care.  Please do not hesitate to call for any questions or concerns.  The duration of this appointment visit was 15 minutes of face-to-face time with the patient.  Greater than 50% of this time was spent in counseling, explanation of diagnosis, planning of further management, and coordination of care.   Patrcia Dolly, M.D.

## 2015-12-19 NOTE — Patient Instructions (Signed)
1. Continue Lamotrigine 100mg : Take 1 tablet in AM, 1.5 tabs in PM 2. Continue to keep a calendar of your symptoms 3. Follow-up in 8 months, call for any changes  Seizure Precautions: 1. If medication has been prescribed for you to prevent seizures, take it exactly as directed.  Do not stop taking the medicine without talking to your doctor first, even if you have not had a seizure in a long time.   2. Avoid activities in which a seizure would cause danger to yourself or to others.  Don't operate dangerous machinery, swim alone, or climb in high or dangerous places, such as on ladders, roofs, or girders.  Do not drive unless your doctor says you may.  3. If you have any warning that you may have a seizure, lay down in a safe place where you can't hurt yourself.    4.  No driving for 6 months from last seizure, as per [place].   Please refer to the following link on the Epilepsy Foundation of America's website for more information: http://www.epilepsyfoundation.org/answerplace/Social/driving/drivingu.cfm   5.  Maintain good sleep hygiene. Avoid alcohol.  6.  Contact your doctor if you have any problems that may be related to the medicine you are taking.  7.  Call 911 and bring the patient back to the ED if:        A.  The seizure lasts longer than 5 minutes.       B.  The patient doesn't awaken shortly after the seizure  C.  The patient has new problems such as difficulty seeing, speaking or moving  D.  The patient was injured during the seizure  E.  The patient has a temperature over 102 F (39C)  F.  The patient vomited and now is having trouble breathing

## 2016-08-17 ENCOUNTER — Ambulatory Visit: Payer: Self-pay | Admitting: Neurology

## 2016-08-29 ENCOUNTER — Telehealth: Payer: Self-pay

## 2016-08-29 NOTE — Telephone Encounter (Signed)
-----   Message from Van ClinesKaren M Aquino, MD sent at 08/29/2016 11:33 AM EDT ----- Regarding: FW: 3 No Shows Pls call him about the seizures, did he miss any doses, was he sick or sleep deprived, any alcohol use? If none of these, pls have him increase Lamotrigine to 150mg  twice a day. Thanks  Clydie BraunKaren  ----- Message ----- From: Sheryn Bisoneed, Brandy L Sent: 08/29/2016  11:05 AM To: Van ClinesKaren M Aquino, MD Subject: RE: 3 No Shows                                 I did get him rescheduled but not until October. He said he has had a couple Seizures in the past few days. I put him on the wait list as High Priority.   Thanks ----- Message ----- From: Van ClinesAquino, Karen M, MD Sent: 08/29/2016  10:38 AM To: Teddi C Glass, Brandy L Reed Subject: RE: 3 No Shows                                 That is fine, but pls let him know our no show policy, if he misses this appt, we cannot r/s him anymore. Teddi, pls send letter as well. Thanks  Clydie BraunKaren  ----- Message ----- From: Sheryn Bisoneed, Brandy L Sent: 08/29/2016  10:30 AM To: Van ClinesKaren M Aquino, MD Subject: 3 No Shows                                     Hi Dr. Karel JarvisAquino  This patient has had 3 No Shows already and he left a message wanting to reschedule. Is it ok to reschedule him?  Thanks WalgreenBrandy

## 2016-08-29 NOTE — Telephone Encounter (Signed)
LMOM asking pt to contact our office so I can obtain the information below.

## 2016-08-31 ENCOUNTER — Encounter: Payer: Self-pay | Admitting: Neurology

## 2016-09-17 ENCOUNTER — Ambulatory Visit (INDEPENDENT_AMBULATORY_CARE_PROVIDER_SITE_OTHER): Payer: Self-pay | Admitting: Neurology

## 2016-09-17 ENCOUNTER — Encounter: Payer: Self-pay | Admitting: Neurology

## 2016-09-17 VITALS — BP 110/70 | HR 74 | Ht 74.0 in | Wt 156.1 lb

## 2016-09-17 DIAGNOSIS — G40009 Localization-related (focal) (partial) idiopathic epilepsy and epileptic syndromes with seizures of localized onset, not intractable, without status epilepticus: Secondary | ICD-10-CM

## 2016-09-17 MED ORDER — LAMOTRIGINE 100 MG PO TABS: ORAL_TABLET | ORAL | 11 refills | Status: DC

## 2016-09-17 NOTE — Progress Notes (Signed)
NEUROLOGY FOLLOW UP OFFICE NOTE  Ray Hill [id number]  HISTORY OF PRESENT ILLNESS: I had the pleasure of seeing Ray Hill in follow-up in the neurology clinic on 09/17/2016.  The patient was last seen 9 months ago for right temporal lobe epilepsy. Since his last visit, he had called our office at the beginning of the month and reported a couple of seizures. He reports one seizure this month and another last month. Prior to this, he reports last witnessed seizure was in December 2015. He reported waking up with urinary incontinence last September 2017, but did not feel sore and was not sure if it was due to seizure. 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 He feels that the seizures occurred around the times where he was missing medication more. Both seizures were out of sleep, witnessed by his mother. He denies any side effects on Lamotrigine. He denies any staring/unresponsive episodes, no gaps in time. He has a little deja vu from time to time, but not like he used to. He denies any olfactory/gustatory hallucinations, focal numbness/tingling/weakness. He has occasional left arm numbness that he attributes to left shoulder problems, he cannot lift arm above shoulder level.   Lamictal level 12/14/14: 9.7  HPI: This is a 41 yo RH man with a history of seizures since age 43. His mother has witnessed the seizures that occur in the early morning hours out of sleep. His mother would hear him convulsing, he would struggle to get out of the bed and be confused for 30 minutes or so, or go back to sleep. Seizures last 5-10 minutes. They were occurring approximately every 4 months. He was initially seen by Dr. Terrace Arabia, records unavailable for review. He was evaluated at [facility] in January 2015, where he reported that alcohol consumption had increased but was unable to  correlate if seizures increased around more alcohol intake. He reported chronic left shoulder pain due to falling out of the bed after a seizure a few years ago, unable to lift his arm a little more than above the shoulder. He had an MRI brain on 04/28/13 which reported a few subcortical and deep cerebral white matter punctate foci of increased T2/FLAIR signal, otherwise unremarkable. Images unavailable for review. He had a routine EEG which was abnormal, with right temporal slowing and sharp waves, as well as capturing one electroclinical seizure arising from the right temporal region with report of hand and mouth automatisms that arose out of drowsiness. He was started on low dose Zonegran and had been taking 100mg /day. He had a nocturnal seizure on 01/01/14, then another seizure on 01/02/14 which is the first time it occurred while awake. He recalls having a sensation of deja vu 15 to 20 minutes prior the seizure, then was amnestic of the event. He denied any prior sleep deprivation and had reduced alcohol intake. He was brought to [facility] where he was found to have a small amount of subarachnoid hemorrhage in the right sylvian fissure without mass effect. Repeat head CT on 10/11 noted that this was much less well seen. He was evaluated and cleared from neurosurgical standpoint. His Zonegran was increased to 200mg /day. He had another seizure while admitted to the hospital on 01/04/14 with confusion and picking at his clothing lasting 2 minutes. He denies any post-ictal focal weakness.   He reports having deja  vu around once a month. He denies anyolfactory/gustatory hallucinations, rising epigastric sensation, focal numbness/tingling/weakness, myoclonic jerks. He and his mother report problems with his memory. He is not driving and is currently unemployed. His mother has noticed increased irritability, moodiness, and agitation since starting Zonegran. He denies any suicidal or homicidal ideation.  Epilepsy Risk  Factors: He had a normal birth and early development. There is no history of febrile convulsions, CNS infections such as meningitis/encephalitis, significant traumatic brain injury, neurosurgical procedures, or family history of seizures.  EEGs: 06/02/13 at [facility]: Intermittent right temporal delta slowing is seen. Frequent right temporal sharp waves are seen, maximal at F8; a brief semi-periodic run of these at 0.5 to 1Hz  occurs in the post hyperventilation period. During the recording, the patient has one electroclinical seizure with hand and mouth automatisms that arose out of drowsiness. The EEG shows arousal from stage N1 sleep at 14:12:54, and after clinical onset, at 14:13:04 electrographic onset occurs with 7Hz  rhythmic theta frequenices in the right temporal region which quickly spread throughout the right hemisphere and then to the midline and left parasagittal derivations while slowing to 4.5Hz  rhythmic theta. The ictal rhythm then further evolved to somewhat sharply contoured delta frequencies with superimposed faster frequencies which were again more localized to the right hemisphere, maximal in the right temporal region and which slowed to around 2Hz  prior to offset at 14:14:05.  PAST MEDICAL HISTORY: Past Medical History:  Diagnosis Date  . Seizures (HCC)     MEDICATIONS: Current Outpatient Prescriptions on File Prior to Visit  Medication Sig Dispense Refill  . lamoTRIgine (LAMICTAL) 100 MG tablet Take 1 tablet in AM, 1 & 1/2 tablets in PM 75 tablet 11   No current facility-administered medications on file prior to visit.     ALLERGIES: No Known Allergies  FAMILY HISTORY: Family History  Problem Relation Age of Onset  . Cancer Maternal Grandmother        ovarian cancer     SOCIAL HISTORY: Social History   Social History  . Marital status: Single    Spouse name: N/A  . Number of children: N/A  . Years of education: N/A   Occupational History  . Not on file.    Social History Main Topics  . Smoking status: Current Every Day Smoker    Packs/day: 1.00    Types: Cigarettes  . Smokeless tobacco: Never Used  . Alcohol use 0.0 oz/week     Comment: rarely  . Drug use: Yes    Types: Marijuana  . Sexual activity: Not on file   Other Topics Concern  . Not on file   Social History Narrative  . No narrative on file    REVIEW OF SYSTEMS: Constitutional: No fevers, chills, or sweats, no generalized fatigue, change in appetite Eyes: No visual changes, double vision, eye pain Ear, nose and throat: No hearing loss, ear pain, nasal congestion, sore throat Cardiovascular: No chest pain, palpitations Respiratory:  No shortness of breath at rest or with exertion, wheezes GastrointestinaI: No nausea, vomiting, diarrhea, abdominal pain, fecal incontinence Genitourinary:  No dysuria, urinary retention or frequency Musculoskeletal:  No neck pain, back pain Integumentary: No rash, pruritus, skin lesions Neurological: as above Psychiatric: No depression, insomnia, anxiety Endocrine: No palpitations, fatigue, diaphoresis, mood swings, change in appetite, change in weight, increased thirst Hematologic/Lymphatic:  No anemia, purpura, petechiae. Allergic/Immunologic: no itchy/runny eyes, nasal congestion, recent allergic reactions, rashes  PHYSICAL EXAM: Vitals:   09/17/16 1502  BP: 110/70  Pulse: 74  General: No acute distress, became tearful talking about his seizures Head:  Normocephalic/atraumatic Neck: supple, no paraspinal tenderness, full range of motion Heart:  Regular rate and rhythm Lungs:  Clear to auscultation bilaterally Back: No paraspinal tenderness Skin/Extremities: No rash, no edema Neurological Exam: alert and oriented to person, place, and time. No aphasia or dysarthria. Fund of knowledge is appropriate.  Recent and remote memory are intact. Attention and concentration are normal.    Able to name objects and repeat phrases. Cranial  nerves: Pupils equal, round, reactive to light. Extraocular movements intact with no nystagmus. Visual fields full. Facial sensation intact. No facial asymmetry. Tongue, uvula, palate midline.  Motor: Bulk and tone normal, muscle strength 5/5 throughout with no pronator drift.  Sensation to light touch intact.  No extinction to double simultaneous stimulation. Finger to nose testing intact.  Gait narrow-based and steady, able to tandem walk adequately.  Romberg negative.  IMPRESSION: This is a 41 yo RH man with right temporal lobe epilepsy. He had been having nocturnal convulsions since age 53, and had his first seizure out of wakefulness last 01/02/14. No convulsions since 03/01/14, but reported waking up with urinary incontinence in September 2017 but not feeling sore, unsure if due to seizure. He reports 2 seizures in the past month, he has been missing his evening dose at times due to his work schedule. We discussed taking medication every 10 hours instead of every 12 hours, if this will help with compliance. He will try and let us know if seizures continue, at which point would increase dose of medication. We again discussed [place] driving laws that indicate that one should not drive until 6 months seizure-free, he knows to stop driving after a seizure. We did discuss that majority of his seizures are nocturnal, and will fill out DMV forms for him for consideration for driving before the 27-month restriction is over. He will follow-up in 6 months and knows to call for any changes.   Thank you for allowing me to participate in his care.  Please do not hesitate to call for any questions or concerns.  The duration of this appointment visit was 15 minutes of face-to-face time with the patient.  Greater than 50% of this time was spent in counseling, explanation of diagnosis, planning of further management, and coordination of care.   Patrcia Dolly, M.D.

## 2016-09-17 NOTE — Patient Instructions (Signed)
1. Continue Lamotrigine 100mg : take 1 tablet in AM, 1.5 tablets in PM 2. If seizures continue despite taking medications more regularly, call our office 3. Follow-up in 6 months, call for any changes  Seizure Precautions: 1. If medication has been prescribed for you to prevent seizures, take it exactly as directed.  Do not stop taking the medicine without talking to your doctor first, even if you have not had a seizure in a long time.   2. Avoid activities in which a seizure would cause danger to yourself or to others.  Don't operate dangerous machinery, swim alone, or climb in high or dangerous places, such as on ladders, roofs, or girders.  Do not drive unless your doctor says you may.  3. If you have any warning that you may have a seizure, lay down in a safe place where you can't hurt yourself.    4.  No driving for 6 months from last seizure, as per [facility] [place] law.   Please refer to the following link on the Epile[employer] Foundation of America's website for more information: http://www.epilepsyfoundation.org/answerplace/Social/driving/drivingu.cfm   5.  Maintain good sleep hygiene. Avoid alcohol.  6.  Contact your doctor if you have any problems that may be related to the medicine you are taking.  7.  Call 911 and bring the patient back to the ED if:        A.  The seizure lasts longer than 5 minutes.       B.  The patient doesn't awaken shortly after the seizure  C.  The patient has new problems such as difficulty seeing, speaking or moving  D.  The patient was injured during the seizure  E.  The patient has a temperature over 102 F (39C)  F.  The patient vomited and now is having trouble breathing

## 2017-01-07 ENCOUNTER — Ambulatory Visit: Payer: Self-pay | Admitting: Neurology

## 2017-02-11 ENCOUNTER — Telehealth: Payer: Self-pay | Admitting: Neurology

## 2017-02-11 NOTE — Telephone Encounter (Signed)
Patient needs to get car insurance and cant due to something with his drivers license

## 2017-02-12 ENCOUNTER — Telehealth: Payer: Self-pay | Admitting: Neurology

## 2017-02-12 NOTE — Telephone Encounter (Signed)
Pt called and wanted to know if the paperwork he dropped off for DMV was ready yet

## 2017-02-18 ENCOUNTER — Telehealth: Payer: Self-pay | Admitting: Neurology

## 2017-02-18 NOTE — Telephone Encounter (Signed)
Patient Lmom to have you please call. Thanks

## 2017-02-19 NOTE — Telephone Encounter (Signed)
Patient states that he has left several messages and came by the office about paper work and has not gotten a call back please call he needs to see if the paper work in ready I looked up front but did not see it. Patient call back number is  [phone]

## 2017-02-19 NOTE — Telephone Encounter (Signed)
Spoke with pt relaying message below.  He states he will try to come by the office today or tomorrow.

## 2017-02-19 NOTE — Telephone Encounter (Signed)
Paperwork faxed to [facility] and placed up front for pt pick up.

## 2017-03-04 ENCOUNTER — Ambulatory Visit: Payer: Self-pay | Admitting: Neurology

## 2017-03-07 ENCOUNTER — Encounter: Payer: Self-pay | Admitting: Neurology

## 2017-03-07 ENCOUNTER — Ambulatory Visit (INDEPENDENT_AMBULATORY_CARE_PROVIDER_SITE_OTHER): Payer: Self-pay | Admitting: Neurology

## 2017-03-07 VITALS — BP 108/82 | HR 75 | Ht 74.0 in | Wt 153.0 lb

## 2017-03-07 DIAGNOSIS — G40009 Localization-related (focal) (partial) idiopathic epilepsy and epileptic syndromes with seizures of localized onset, not intractable, without status epilepticus: Secondary | ICD-10-CM

## 2017-03-07 MED ORDER — LAMOTRIGINE 100 MG PO TABS: ORAL_TABLET | ORAL | 11 refills | Status: DC

## 2017-03-07 NOTE — Progress Notes (Signed)
NEUROLOGY FOLLOW UP OFFICE NOTE  Ray Hill [id number]  HISTORY OF PRESENT ILLNESS: I had the pleasure of seeing Ray Hill in follow-up in the neurology clinic on 03/07/2017.  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 Since his last visit, he denies any further seizures since June 2018. Prior to May/June 2018, he had been seizure for more than a year. He is taking Lamotrigine 100mg  in AM, 150mg  in PM without side effects. He denies any further episodes of deja vu. 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 He is excited to return to driving. He denies any staring/unresponsive episodes, no gaps in time, olfactory/gustatory hallucinations, focal numbness/tingling/weakness.   Lamictal level 12/14/14: 9.7  HPI: This is a 41 yo RH man with a history of seizures since age 22. His mother has witnessed the seizures that occur in the early morning hours out of sleep. His mother would hear him convulsing, he would struggle to get out of the bed and be confused for 30 minutes or so, or go back to sleep. Seizures last 5-10 minutes. They were occurring approximately every 4 months. He was initially seen by Dr. Terrace Arabia, records unavailable for review. He was evaluated at [facility] in January 2015, where he reported that alcohol consumption had increased but was unable to correlate if seizures increased around more alcohol intake. He reported chronic left shoulder pain due to falling out of the bed after a seizure a few years ago, unable to lift his arm a little more than above the shoulder. He had an MRI brain on 04/28/13 which reported a few subcortical and deep cerebral white matter punctate foci of increased T2/FLAIR signal, otherwise unremarkable. Images unavailable for review. He had a routine EEG which was abnormal, with right temporal slowing and sharp waves, as well as capturing one  electroclinical seizure arising from the right temporal region with report of hand and mouth automatisms that arose out of drowsiness. He was started on low dose Zonegran and had been taking 100mg /day. He had a nocturnal seizure on 01/01/14, then another seizure on 01/02/14 which is the first time it occurred while awake. He recalls having a sensation of deja vu 15 to 20 minutes prior the seizure, then was amnestic of the event. He denied any prior sleep deprivation and had reduced alcohol intake. He was brought to [facility] where he was found to have a small amount of subarachnoid hemorrhage in the right sylvian fissure without mass effect. Repeat head CT on 10/11 noted that this was much less well seen. He was evaluated and cleared from neurosurgical standpoint. His Zonegran was increased to 200mg /day. He had another seizure while admitted to the hospital on 01/04/14 with confusion and picking at his clothing lasting 2 minutes. He denies any post-ictal focal weakness.   He reports having deja vu around once a month. He denies anyolfactory/gustatory hallucinations, rising epigastric sensation, focal numbness/tingling/weakness, myoclonic jerks. He and his mother report problems with his memory. He is not driving and is currently unemployed. His mother has noticed increased irritability, moodiness, and agitation since starting Zonegran. He denies any suicidal or homicidal ideation.  Epilepsy Risk Factors: He had a normal birth and early development. There is no history of febrile convulsions, CNS infections such as meningitis/encephalitis, significant traumatic brain injury, neurosurgical procedures, or family history of seizures.  EEGs: 06/02/13 at [facility]:  Intermittent right temporal delta slowing is seen. Frequent right temporal sharp waves are seen, maximal at F8; a brief semi-periodic run of these at 0.5 to 1Hz  occurs in the post hyperventilation period. During the recording, the patient has one electroclinical  seizure with hand and mouth automatisms that arose out of drowsiness. The EEG shows arousal from stage N1 sleep at 14:12:54, and after clinical onset, at 14:13:04 electrographic onset occurs with 7Hz  rhythmic theta frequenices in the right temporal region which quickly spread throughout the right hemisphere and then to the midline and left parasagittal derivations while slowing to 4.5Hz  rhythmic theta. The ictal rhythm then further evolved to somewhat sharply contoured delta frequencies with superimposed faster frequencies which were again more localized to the right hemisphere, maximal in the right temporal region and which slowed to around 2Hz  prior to offset at 14:14:05.  PAST MEDICAL HISTORY: Past Medical History:  Diagnosis Date  . Seizures (HCC)     MEDICATIONS: Current Outpatient Medications on File Prior to Visit  Medication Sig Dispense Refill  . lamoTRIgine (LAMICTAL) 100 MG tablet Take 1 tablet in AM, 1 & 1/2 tablets in PM 75 tablet 11   No current facility-administered medications on file prior to visit.     ALLERGIES: No Known Allergies  FAMILY HISTORY: Family History  Problem Relation Age of Onset  . Cancer Maternal Grandmother        ovarian cancer     SOCIAL HISTORY: Social History   Socioeconomic History  . Marital status: Single    Spouse name: Not on file  . Number of children: Not on file  . Years of education: Not on file  . Highest education level: Not on file  Social Needs  . Financial resource strain: Not on file  . Food insecurity - worry: Not on file  . Food insecurity - inability: Not on file  . Transportation needs - medical: Not on file  . Transportation needs - non-medical: Not on file  Occupational History  . Occupation: security  Tobacco Use  . Smoking status: Current Every Day Smoker    Packs/day: 1.00    Types: Cigarettes  . Smokeless tobacco: Never Used  Substance and Sexual Activity  . Alcohol use: Yes    Alcohol/week: 0.0 oz     Comment: rarely  . Drug use: Yes    Types: Marijuana  . Sexual activity: Not on file  Other Topics Concern  . Not on file  Social History Narrative   [place] and works as a Office managersecurity and works over night. Lives in a 2 story home. Does not have any problems with stairs. Lives with mother and sister.     REVIEW OF SYSTEMS: Constitutional: No fevers, chills, or sweats, no generalized fatigue, change in appetite Eyes: No visual changes, double vision, eye pain Ear, nose and throat: No hearing loss, ear pain, nasal congestion, sore throat Cardiovascular: No chest pain, palpitations Respiratory:  No shortness of breath at rest or with exertion, wheezes GastrointestinaI: No nausea, vomiting, diarrhea, abdominal pain, fecal incontinence Genitourinary:  No dysuria, urinary retention or frequency Musculoskeletal:  No neck pain, back pain Integumentary: No rash, pruritus, skin lesions Neurological: as above Psychiatric: No depression, insomnia, anxiety Endocrine: No palpitations, fatigue, diaphoresis, mood swings, change in appetite, change in weight, increased thirst Hematologic/Lymphatic:  No anemia, purpura, petechiae. Allergic/Immunologic: no itchy/runny eyes, nasal congestion, recent allergic reactions, rashes  PHYSICAL EXAM: Vitals:   03/07/17 1524  BP: 108/82  Pulse: 75  SpO2:  98%   General: No acute distress, became tearful talking about his seizures Head:  Normocephalic/atraumatic Neck: supple, no paraspinal tenderness, full range of motion Heart:  Regular rate and rhythm Lungs:  Clear to auscultation bilaterally Back: No paraspinal tenderness Skin/Extremities: No rash, no edema Neurological Exam: alert and oriented to person, place, and time. No aphasia or dysarthria. Fund of knowledge is appropriate.  Recent and remote memory are intact. Attention and concentration are normal.    Able to name objects and repeat phrases. Cranial nerves: Pupils equal, round, reactive  to light. Extraocular movements intact with no nystagmus. Visual fields full. Facial sensation intact. No facial asymmetry. Tongue, uvula, palate midline.  Motor: Bulk and tone normal, muscle strength 5/5 throughout with no pronator drift.  Sensation to light touch intact.  No extinction to double simultaneous stimulation. Finger to nose testing intact.  Gait narrow-based and steady, able to tandem walk adequately.  Romberg negative.  IMPRESSION: This is a 41 yo RH man with right temporal lobe epilepsy. He had been having nocturnal convulsions since age 41, and had his first seizure out of wakefulness last 01/02/14. No seizures since June 2018, seizures occurred when he would miss his evening dose at times due to his work schedule. He is doing well on Lamotrigine 100mg  in AM, 150mg  in PM, continue current dose. We again discussed [place] driving laws that indicate that one should not drive until 6 months seizure-free, he knows to stop driving after a seizure. We did discuss that majority of his seizures are nocturnal, DMV forms had been filled out for him previously. He will follow-up in 6 months and knows to call for any changes.   Thank you for allowing me to participate in his care.  Please do not hesitate to call for any questions or concerns.  The duration of this appointment visit was 15 minutes of face-to-face time with the patient.  Greater than 50% of this time was spent in counseling, explanation of diagnosis, planning of further management, and coordination of care.   Patrcia DollyKaren Chauntae Hults, M.D.

## 2017-03-07 NOTE — Patient Instructions (Signed)
1. Continue Lamotrigine 100mg : Take 1 tablet in AM, 1.5 tablets in PM 2. Start using an alarm on your phone as a reminder to take your medication 3. Follow-up in 6 months, call for any changes  Seizure Precautions: 1. If medication has been prescribed for you to prevent seizures, take it exactly as directed.  Do not stop taking the medicine without talking to your doctor first, even if you have not had a seizure in a long time.   2. Avoid activities in which a seizure would cause danger to yourself or to others.  Don't operate dangerous machinery, swim alone, or climb in high or dangerous places, such as on ladders, roofs, or girders.  Do not drive unless your doctor says you may.  3. If you have any warning that you may have a seizure, lay down in a safe place where you can't hurt yourself.    4.  No driving for 6 months from last seizure, as per [facility] [place] law.   Please refer to the following link on the Epilepsy Foundation of America's website for more information: http://www.epilepsyfoundation.org/answerplace/Social/driving/drivingu.cfm   5.  Maintain good sleep hygiene. Avoid alcohol.  6.  Contact your doctor if you have any problems that may be related to the medicine you are taking.  7.  Call 911 and bring the patient back to the ED if:        A.  The seizure lasts longer than 5 minutes.       B.  The patient doesn't awaken shortly after the seizure  C.  The patient has new problems such as difficulty seeing, speaking or moving  D.  The patient was injured during the seizure  E.  The patient has a temperature over 102 F (39C)  F.  The patient vomited and now is having trouble breathing

## 2017-05-24 ENCOUNTER — Telehealth: Payer: Self-pay | Admitting: Neurology

## 2017-05-24 ENCOUNTER — Other Ambulatory Visit: Payer: Self-pay

## 2017-05-24 DIAGNOSIS — G40009 Localization-related (focal) (partial) idiopathic epilepsy and epileptic syndromes with seizures of localized onset, not intractable, without status epilepticus: Secondary | ICD-10-CM

## 2017-05-24 MED ORDER — LAMOTRIGINE 100 MG PO TABS: ORAL_TABLET | ORAL | 11 refills | Status: DC

## 2017-05-24 NOTE — Telephone Encounter (Signed)
Pharmacy updated. Rx sent.

## 2017-05-24 NOTE — Telephone Encounter (Signed)
Patient called and needed to update pharmacy to [facility] [address]. He also needs a refill on his Lamotrigine. Thanks

## 2017-09-02 ENCOUNTER — Other Ambulatory Visit: Payer: Self-pay

## 2017-09-02 ENCOUNTER — Encounter: Payer: Self-pay | Admitting: Neurology

## 2017-09-02 ENCOUNTER — Ambulatory Visit (INDEPENDENT_AMBULATORY_CARE_PROVIDER_SITE_OTHER): Payer: Self-pay | Admitting: Neurology

## 2017-09-02 VITALS — BP 110/72 | HR 80 | Ht 74.0 in | Wt 150.0 lb

## 2017-09-02 DIAGNOSIS — G40009 Localization-related (focal) (partial) idiopathic epilepsy and epileptic syndromes with seizures of localized onset, not intractable, without status epilepticus: Secondary | ICD-10-CM

## 2017-09-02 DIAGNOSIS — F32A Depression, unspecified: Secondary | ICD-10-CM

## 2017-09-02 DIAGNOSIS — F329 Major depressive disorder, single episode, unspecified: Secondary | ICD-10-CM

## 2017-09-02 MED ORDER — LAMOTRIGINE 100 MG PO TABS: ORAL_TABLET | ORAL | 11 refills | Status: DC

## 2017-09-02 MED ORDER — SERTRALINE HCL 25 MG PO TABS: 25.0000 mg | ORAL_TABLET | Freq: Every day | ORAL | 5 refills | Status: DC

## 2017-09-02 NOTE — Progress Notes (Signed)
NEUROLOGY FOLLOW UP OFFICE NOTE  Ray Hill [id number]  DOB: 1975-08-31  HISTORY OF PRESENT ILLNESS: I had the pleasure of seeing Ray Hill in follow-up in the neurology clinic on 09/02/2017.  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 He is accompanied by his girlfriend who helps supplement the history today. They deny any seizures since June 2018. He denies any deja vu episodes as well. He is taking Lamotrigine 100mg  in AM, 150mg  in PM without side effects. His girlfriend is concerned about weight loss, as well as being angry all the time. She reports he has had attitude for a year now, "sometimes you never know why he has attitude." He won't eat for days when he is angry. He also calls her about "crazy thoughts," he states these are different from the deja vu, his mind keeps going all the time. No suicidal or homicidal ideation. His girlfriend also worries that the Lamictal is the cause that they cannot conceive a child. She denies any nocturnal seizures or staring/unresponsive episodes. He denies any gaps in time, olfactory/gustatory hallucinations, focal numbness/tingling/weakness, headaches, dizziness, no falls.   HPI: This is a 42 yo RH man with a history of seizures since age 38. His mother has witnessed the seizures that occur in the early morning hours out of sleep. His mother would hear him convulsing, he would struggle to get out of the bed and be confused for 30 minutes or so, or go back to sleep. Seizures last 5-10 minutes. They were occurring approximately every 4 months. He was initially seen by Dr. Terrace Arabia, records unavailable for review. He was evaluated at [facility] in January 2015, where he reported that alcohol consumption had increased but was unable to correlate if seizures increased around more alcohol intake. He reported chronic left shoulder pain due to falling out of the bed after a seizure a few years ago, unable to lift his arm a little more  than above the shoulder. He had an MRI brain on 04/28/13 which reported a few subcortical and deep cerebral white matter punctate foci of increased T2/FLAIR signal, otherwise unremarkable. Images unavailable for review. He had a routine EEG which was abnormal, with right temporal slowing and sharp waves, as well as capturing one electroclinical seizure arising from the right temporal region with report of hand and mouth automatisms that arose out of drowsiness. He was started on low dose Zonegran and had been taking 100mg /day. He had a nocturnal seizure on 01/01/14, then another seizure on 01/02/14 which is the first time it occurred while awake. He recalls having a sensation of deja vu 15 to 20 minutes prior the seizure, then was amnestic of the event. He denied any prior sleep deprivation and had reduced alcohol intake. He was brought to [facility] where he was found to have a small amount of subarachnoid hemorrhage in the right sylvian fissure without mass effect. Repeat head CT on 10/11 noted that this was much less well seen. He was evaluated and cleared from neurosurgical standpoint. His Zonegran was increased to 200mg /day. He had another seizure while admitted to the hospital on 01/04/14 with confusion and picking at his clothing lasting 2 minutes. He denies any post-ictal focal weakness.   He reports having deja vu around once a month. He denies anyolfactory/gustatory hallucinations, rising epigastric sensation, focal numbness/tingling/weakness, myoclonic jerks. He and his mother report problems with his memory. He is not driving and is currently unemployed. His mother has noticed increased irritability,  moodiness, and agitation since starting Zonegran. He denies any suicidal or homicidal ideation.  Epilepsy Risk Factors: He had a normal birth and early development. There is no history of febrile convulsions, CNS infections such as meningitis/encephalitis, significant traumatic brain injury, neurosurgical  procedures, or family history of seizures.  EEGs: 06/02/13 at [facility]: Intermittent right temporal delta slowing is seen. Frequent right temporal sharp waves are seen, maximal at F8; a brief semi-periodic run of these at 0.5 to 1Hz  occurs in the post hyperventilation period. During the recording, the patient has one electroclinical seizure with hand and mouth automatisms that arose out of drowsiness. The EEG shows arousal from stage N1 sleep at 14:12:54, and after clinical onset, at 14:13:04 electrographic onset occurs with 7Hz  rhythmic theta frequenices in the right temporal region which quickly spread throughout the right hemisphere and then to the midline and left parasagittal derivations while slowing to 4.5Hz  rhythmic theta. The ictal rhythm then further evolved to somewhat sharply contoured delta frequencies with superimposed faster frequencies which were again more localized to the right hemisphere, maximal in the right temporal region and which slowed to around 2Hz  prior to offset at 14:14:05.  Prior AEDs: Zonisamide  PAST MEDICAL HISTORY: Past Medical History:  Diagnosis Date  . Seizures (HCC)     MEDICATIONS: Current Outpatient Medications on File Prior to Visit  Medication Sig Dispense Refill  . lamoTRIgine (LAMICTAL) 100 MG tablet Take 1 tablet in AM, 1.5 tablets in PM 75 tablet 11   No current facility-administered medications on file prior to visit.     ALLERGIES: No Known Allergies  FAMILY HISTORY: Family History  Problem Relation Age of Onset  . Cancer Maternal Grandmother        ovarian cancer     SOCIAL HISTORY: Social History   Socioeconomic History  . Marital status: Single    Spouse name: Not on file  . Number of children: Not on file  . Years of education: Not on file  . Highest education level: Not on file  Occupational History  . Occupation: Diplomatic Services operational officersecurity  Social Needs  . Financial resource strain: Not on file  . Food insecurity:    Worry: Not on file     Inability: Not on file  . Transportation needs:    Medical: Not on file    Non-medical: Not on file  Tobacco Use  . Smoking status: Current Every Day Smoker    Packs/day: 1.00    Types: Cigarettes  . Smokeless tobacco: Never Used  Substance and Sexual Activity  . Alcohol use: Yes    Alcohol/week: 0.0 oz    Comment: rarely  . Drug use: Yes    Types: Marijuana  . Sexual activity: Not on file  Lifestyle  . Physical activity:    Days per week: Not on file    Minutes per session: Not on file  . Stress: Not on file  Relationships  . Social connections:    Talks on phone: Not on file    Gets together: Not on file    Attends religious service: Not on file    Active member of club or organization: Not on file    Attends meetings of clubs or organizations: Not on file    Relationship status: Not on file  . Intimate partner violence:    Fear of current or ex partner: Not on file    Emotionally abused: Not on file    Physically abused: Not on file    Forced sexual activity:  Not on file  Other Topics Concern  . 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 Lives in a 2 story home. Does not have any problems with stairs. Lives with mother and sister.     REVIEW OF SYSTEMS: Constitutional: No fevers, chills, or sweats, no generalized fatigue, change in appetite Eyes: No visual changes, double vision, eye pain Ear, nose and throat: No hearing loss, ear pain, nasal congestion, sore throat Cardiovascular: No chest pain, palpitations Respiratory:  No shortness of breath at rest or with exertion, wheezes GastrointestinaI: No nausea, vomiting, diarrhea, abdominal pain, fecal incontinence Genitourinary:  No dysuria, urinary retention or frequency Musculoskeletal:  No neck pain, back pain Integumentary: No rash, pruritus, skin lesions Neurological: as above Psychiatric: + depression,no insomnia, anxiety Endocrine: No  palpitations, fatigue, diaphoresis, mood swings, change in appetite, change in weight, increased thirst Hematologic/Lymphatic:  No anemia, purpura, petechiae. Allergic/Immunologic: no itchy/runny eyes, nasal congestion, recent allergic reactions, rashes  PHYSICAL EXAM: Vitals:   09/02/17 1534  BP: 110/72  Pulse: 80  SpO2: 98%   General: No acute distress Head:  Normocephalic/atraumatic Neck: supple, no paraspinal tenderness, full range of motion Heart:  Regular rate and rhythm Lungs:  Clear to auscultation bilaterally Back: No paraspinal tenderness Skin/Extremities: No rash, no edema Neurological Exam: alert and oriented to person, place, and time. No aphasia or dysarthria. Fund of knowledge is appropriate.  Recent and remote memory are intact. Attention and concentration are normal.    Able to name objects and repeat phrases. Cranial nerves: Pupils equal, round, reactive to light. Extraocular movements intact with no nystagmus. Visual fields full. Facial sensation intact. No facial asymmetry. Tongue, uvula, palate midline.  Motor: Bulk and tone normal, muscle strength 5/5 throughout with no pronator drift.  Sensation to light touch intact.  No extinction to double simultaneous stimulation. Finger to nose testing intact.  Gait narrow-based and steady, able to tandem walk adequately.  Romberg negative.  IMPRESSION: This is a 42 yo RH man with right temporal lobe epilepsy. He had been having nocturnal convulsions since age 53, and had his first seizure out of wakefulness last 01/02/14. No seizures since June 2018, seizures occurred when he would miss his evening dose at times due to his work schedule. He continues to do well on Lamotrigine 100mg  in AM, 150mg  in PM. Concerns about weight loss, anger issues were addressed, I discussed that they are more suggestive of an underlying depressive disorder rather than a side effect of Lamictal. He is agreeable to starting Zoloft 25mg  daily, side effects  were discussed. They will call the office in 2 months for an update, we may increase dose as tolerated. We discussed fertility issues that are unlikely due to Lamictal as well, they were advised to speak to her gynecologist. He is aware of [place] driving laws to stop driving after a seizure until 6 months seizure-free. He will follow-up in 6 months and knows to call for any changes.   Thank you for allowing me to participate in his care.  Please do not hesitate to call for any questions or concerns.  The duration of this appointment visit was 25 minutes of face-to-face time with the patient.  Greater than 50% of this time was spent in counseling, explanation of diagnosis, planning of further management, and coordination of care.   Patrcia Dolly, M.D.

## 2017-09-02 NOTE — Patient Instructions (Addendum)
1. Start Zoloft (Sertraline) 25mg : take 1 tablet at night. Call our office for an update on how you are feeling in 2 months, we may increase dose if needed 2. Continue Lamictal 100mg : Take 1 tab in AM, 1.5 tabs in PM 3. Follow-up in 6 months, call for any changes  Seizure Precautions: 1. If medication has been prescribed for you to prevent seizures, take it exactly as directed.  Do not stop taking the medicine without talking to your doctor first, even if you have not had a seizure in a long time.   2. Avoid activities in which a seizure would cause danger to yourself or to others.  Don't operate dangerous machinery, swim alone, or climb in high or dangerous places, such as on ladders, roofs, or girders.  Do not drive unless your doctor says you may.  3. If you have any warning that you may have a seizure, lay down in a safe place where you can't hurt yourself.    4.  No driving for 6 months from last seizure, as per [facility] [place].   Please refer to the following link on the Epilepsy Foundation of America's website for more information: http://www.epilepsyfoundation.org/answerplace/Social/driving/drivingu.cfm   5.  Maintain good sleep hygiene. Avoid alcohol.  6.  Contact your doctor if you have any problems that may be related to the medicine you are taking.  7.  Call 911 and bring the patient back to the ED if:        A.  The seizure lasts longer than 5 minutes.       B.  The patient doesn't awaken shortly after the seizure  C.  The patient has new problems such as difficulty seeing, speaking or moving  D.  The patient was injured during the seizure  E.  The patient has a temperature over 102 F (39C)  F.  The patient vomited and now is having trouble breathing

## 2018-04-15 ENCOUNTER — Ambulatory Visit: Payer: Self-pay | Admitting: Neurology

## 2018-04-17 ENCOUNTER — Telehealth: Payer: Self-pay | Admitting: Neurology

## 2018-04-17 NOTE — Telephone Encounter (Signed)
Meagen, please follow up on this.

## 2018-04-17 NOTE — Telephone Encounter (Signed)
Patient called due to him receiving a letter from the [facility]. Regarding a paper not submitted regarding a medical Report. Please Call. Thanks

## 2018-04-18 NOTE — Telephone Encounter (Signed)
Pt returned my call stating that he received the [facility] packet that needs to be filled out and sent to [facility].  I explained that we can fill out the paper work and that his signature is required.  Pt states that he will be by the office today to drop packet off.    Brandy / Chelsea - when pt comes, can you please advise pt that he no showed his appt earlier this week and pls schedule?  Thanks!

## 2018-04-18 NOTE — Telephone Encounter (Signed)
Returned call to pt to get more information about paperwork he is talking about (Epic has no record of DMV paperwork on file for pt)    No answer.  No VM set up.

## 2018-04-28 NOTE — Telephone Encounter (Signed)
Attempted to contact pt to let him know that [facility] paperwork has been completed (mostly) but we need to confirm date of last seizure before I can send it off.  No answer.  No VM set up.  Will attempt to contact again.

## 2018-05-21 ENCOUNTER — Emergency Department
Admission: EM | Admit: 2018-05-21 | Discharge: 2018-05-21 | Disposition: A | Discharge status: Home / Self Care | Payer: Self-pay | Attending: Emergency Medicine | Admitting: Emergency Medicine

## 2018-05-21 ENCOUNTER — Other Ambulatory Visit: Payer: Self-pay

## 2018-05-21 DIAGNOSIS — Z9114 Patient's other noncompliance with medication regimen: Secondary | ICD-10-CM | POA: Insufficient documentation

## 2018-05-21 DIAGNOSIS — Z79899 Other long term (current) drug therapy: Secondary | ICD-10-CM | POA: Insufficient documentation

## 2018-05-21 DIAGNOSIS — F17210 Nicotine dependence, cigarettes, uncomplicated: Secondary | ICD-10-CM | POA: Insufficient documentation

## 2018-05-21 DIAGNOSIS — R569 Unspecified convulsions: Secondary | ICD-10-CM | POA: Insufficient documentation

## 2018-05-21 LAB — BASIC METABOLIC PANEL
Anion gap: 11 (ref 5–15)
BUN: 15 mg/dL (ref 6–20)
CO2: 23 mmol/L (ref 22–32)
Calcium: 8.9 mg/dL (ref 8.9–10.3)
Chloride: 103 mmol/L (ref 98–111)
Creatinine, Ser: 1.09 mg/dL (ref 0.61–1.24)
GFR calc Af Amer: >60 mL/min (ref >60)
GFR calc non Af Amer: >60 mL/min (ref >60)
GLUCOSE: 85 mg/dL (ref 70–99)
Potassium: 4.2 mmol/L (ref 3.5–5.1)
Sodium: 137 mmol/L (ref 135–145)

## 2018-05-21 LAB — ETHANOL: Alcohol, Ethyl (B): <10 mg/dL (ref <10)

## 2018-05-21 LAB — CBC
HCT: 45.6 % (ref 39.0–52.0)
Hemoglobin: 15.3 g/dL (ref 13.0–17.0)
MCH: 30.8 pg (ref 26.0–34.0)
MCHC: 33.6 g/dL (ref 30.0–36.0)
MCV: 91.9 fL (ref 80.0–100.0)
Platelets: 197 10*3/uL (ref 150–400)
RBC: 4.96 MIL/uL (ref 4.22–5.81)
RDW: 12.6 % (ref 11.5–15.5)
WBC: 5.2 10*3/uL (ref 4.0–10.5)
nRBC: 0 % (ref 0.0–0.2)

## 2018-05-21 LAB — URINE DRUG SCREEN, QUALITATIVE (ARMC ONLY)
Amphetamines, Ur Screen: NOT DETECTED
Barbiturates, Ur Screen: NOT DETECTED
Benzodiazepine, Ur Scrn: NOT DETECTED
Cannabinoid 50 Ng, Ur ~~LOC~~: POSITIVE — AB
Cocaine Metabolite,Ur ~~LOC~~: NOT DETECTED
MDMA (Ecstasy)Ur Screen: NOT DETECTED
Methadone Scn, Ur: NOT DETECTED
Opiate, Ur Screen: NOT DETECTED
Phencyclidine (PCP) Ur S: NOT DETECTED
Tricyclic, Ur Screen: NOT DETECTED

## 2018-05-21 LAB — GLUCOSE, CAPILLARY: Glucose-Capillary: 70 mg/dL (ref 70–99)

## 2018-05-21 MED ORDER — LEVETIRACETAM 500 MG PO TABS: 500.0000 mg | ORAL_TABLET | Freq: Two times a day (BID) | ORAL | 1 refills | Status: DC

## 2018-05-21 NOTE — ED Provider Notes (Signed)
[facility] Emergency Department Provider Note  ____________________________________________  Time seen: Approximately 5:23 PM  I have reviewed the triage vital signs and the nursing notes.   HISTORY  Chief Complaint Seizures   HPI Ray Hill is a 43 y.o. male with a history of epilepsy who presents for evaluation after having a witnessed seizure.  Patient is supposed to be on Lamictal however reports that he changes his mood and his behavior therefore he does not take it.  Has not taken it for several months.  Patient reports a seizure a month ago and one today.  The one today was witness, generalized tonic-clonic, lasting about 5 minutes.  Patient is currently driving.  He denies any injuries from his seizure today.  Denies any recent illnesses.  No headache, chest pain, shortness of breath, abdominal pain.   Past Medical History:  Diagnosis Date  . Seizures [facility])     Patient Active Problem List   Diagnosis Date Noted  . Depression 12/01/2014  . Localization-related idiopathic epilepsy and epileptic syndromes with seizures of localized onset, not intractable, without status epilepticus (HCC) 01/20/2014  . SAH (subarachnoid hemorrhage) (HCC) 01/02/2014  . Complex partial seizures (HCC) 01/02/2014  . Acidosis, metabolic 01/02/2014  . History of seizures 02/16/2013  . Smoking 02/16/2013  . Marijuana use 02/16/2013    History reviewed. No pertinent surgical history.  Prior to Admission medications   Medication Sig Start Date End Date Taking? Authorizing Provider  lamoTRIgine (LAMICTAL) 100 MG tablet Take 1 tablet in AM, 1.5 tablets in PM 09/02/17   Van Clines, MD  levETIRAcetam (KEPPRA) 500 MG tablet Take 1 tablet (500 mg total) by mouth 2 (two) times daily. 05/21/18   Nita Sickle, MD  sertraline (ZOLOFT) 25 MG tablet Take 1 tablet (25 mg total) by mouth daily. 09/02/17   Van Clines, MD    Allergies Patient has no known  allergies.  Family History  Problem Relation Age of Onset  . Cancer Maternal Grandmother        ovarian cancer     Social History Social History   Tobacco Use  . Smoking status: Current Every Day Smoker    Packs/day: 1.00    Types: Cigarettes  . Smokeless tobacco: Never Used  Substance Use Topics  . Alcohol use: Yes    Alcohol/week: 0.0 standard drinks    Comment: rarely  . Drug use: Yes    Types: Marijuana    Review of Systems  Constitutional: Negative for fever. Eyes: Negative for visual changes. ENT: Negative for sore throat. Neck: No neck pain  Cardiovascular: Negative for chest pain. Respiratory: Negative for shortness of breath. Gastrointestinal: Negative for abdominal pain, vomiting or diarrhea. Genitourinary: Negative for dysuria. Musculoskeletal: Negative for back pain. Skin: Negative for rash. Neurological: Negative for headaches, weakness or numbness. + seizure Psych: No SI or HI  ____________________________________________   PHYSICAL EXAM:  VITAL SIGNS: Vitals:   05/21/18 1722 05/21/18 1723  BP: 116/86   Pulse:  88  Resp: 16 12  SpO2:  97%   Constitutional: Alert and oriented. Well appearing and in no apparent distress. HEENT:      Head: Normocephalic and atraumatic.         Eyes: Conjunctivae are normal. Sclera is non-icteric.       Mouth/Throat: Mucous membranes are moist.       Neck: Supple with no signs of meningismus. Cardiovascular: Regular rate and rhythm. No murmurs, gallops, or rubs. 2+ symmetrical distal pulses  are present in all extremities. No JVD. Respiratory: Normal respiratory effort. Lungs are clear to auscultation bilaterally. No wheezes, crackles, or rhonchi.  Gastrointestinal: Soft, non tender, and non distended with positive bowel sounds. No rebound or guarding. Musculoskeletal: Nontender with normal range of motion in all extremities. No edema, cyanosis, or erythema of extremities. Neurologic: Normal speech and language.  Face is symmetric. Moving all extremities. No gross focal neurologic deficits are appreciated. Skin: Skin is warm, dry and intact. No rash noted. Psychiatric: Mood and affect are normal. Speech and behavior are normal.  ____________________________________________   LABS (all labs ordered are listed, but only abnormal results are displayed)  Labs Reviewed  URINE DRUG SCREEN, QUALITATIVE ([facility] ONLY) - Abnormal; Notable for the following components:      Result Value   Cannabinoid 50 Ng, Ur Ruth POSITIVE (*)    All other components within normal limits  BASIC METABOLIC PANEL  CBC  ETHANOL  GLUCOSE, CAPILLARY  CBG MONITORING, ED   ____________________________________________  EKG  ED ECG REPORT I, Nita Sickle, the attending physician, personally viewed and interpreted this ECG.  Normal sinus rhythm, rate of 78, normal intervals, normal axis, no ST elevations or depressions.  Unchanged from prior. ____________________________________________  RADIOLOGY  none  ____________________________________________   PROCEDURES  Procedure(s) performed: None Procedures Critical Care performed:  None ____________________________________________   INITIAL IMPRESSION / ASSESSMENT AND PLAN / ED COURSE  43 y.o. male with a history of epilepsy who presents for evaluation after having a witnessed seizure.  Patient is noncompliant with his antiepileptic medications because he does not like the side effects of these medications.  He has been on Zonegran before and is supposed to be on Lamictal.  He reports changes in his personality when he takes his medications.  He endorses alcohol consumption, few beers a few times a week.  No alcohol intake over the last 24 hours.  He denies any drug use.  He is currently neurologically intact and back to his baseline.  Had a long discussion with the patient about dangers of driving with his epilepsy not under control and highly encourage patient to try  different medication.  Patient at this time continues to decline any new medication.  I have emailed patient's neurologist to let her know that he has had 2 seizures over the last month.  I have told patient that he is not allowed to drive until 6 months with no seizure and clearance from his neurologist.  I have also discussed other seizure precautions with him.  Will check basic labs and monitor patient in the emergency room for any recurrence of his seizures. Clinical Course as of May 21 1916  Wed May 21, 2018  1912 Labs with no acute findings.  Drug screen positive for cannabinoids.  Patient was monitored for 2 hours with no recurrence of his seizures.  Continues to decline any antiepileptic medications however did accept a prescription for Keppra and reports that he will give it a try at home. Will discharge home to the care of his sister.  Discussed strict seizure precautions and close follow-up.  Discussed standard return precautions to the emergency room.   [CV]    Clinical Course User Index [CV] Don Perking [place], MD     As part of my medical decision making, I reviewed the following data within the electronic MEDICAL RECORD NUMBER Nursing notes reviewed and incorporated, Labs reviewed , EKG interpreted , Old EKG reviewed, Old chart reviewed, Notes from prior ED visits and [place]  Controlled Substance Database    Pertinent labs & imaging results that were available during my care of the patient were reviewed by me and considered in my medical decision making (see chart for details).    ____________________________________________   FINAL CLINICAL IMPRESSION(S) / ED DIAGNOSES  Final diagnoses:  Seizure (HCC)      NEW MEDICATIONS STARTED DURING THIS VISIT:  ED Discharge Orders         Ordered    levETIRAcetam (KEPPRA) 500 MG tablet  2 times daily     05/21/18 1917           Note:  This document was prepared using Dragon voice recognition software and may include unintentional  dictation errors.    Don Perking, [place], MD 05/21/18 [phone]

## 2018-05-21 NOTE — Discharge Instructions (Signed)
Seizures may happen at any time. It is important to take certain precautions to maintain your safety.   Follow up with your doctor in 1-3 days.  If you were started on a seizure medication, take it as prescribed.  During a seizure, a person may injure himself or herself. Seizure precautions are guidelines that a person can follow in order to minimize injury during a seizure. For any activity, it is important to ask, "What would happen if I had a seizure while doing this?" Follow the below precautions.   DO NOT DRIVE OR OPERATE HEAVY MACHINERY UNTIL CLEARED BY YOUR NEUROLOGIST. If you have a seizure while driving you may cause an accident which could cause serious injury or death to you, others in the vehicle, and others around you at the time of the seizure.   Bathroom Safety  A person with seizures may want to shower instead of bathe to avoid accidental drowning. If falls occur during the patient's typical seizure, a person should use a shower seat, preferably one with a safety strap.  Use nonskid strips in your shower or tub.  Never use electrical equipment near water. This prevents accidental electrocution.  Consider changing glass in shower doors to shatterproof glass.  Secondary school teacher If possible, cook when someone else is nearby.  Use the back burners of the stove to prevent accidental burns.  Use shatterproof containers as much as possible. For instance, sauces can be transferred from glass bottles to plastic containers for use.  Limit time that is required using knives or other sharp objects. If possible, buy foods that are already cut, or ask someone to help in meal preparation.   General Safety at Home Do not smoke or light fires in the fireplace unless someone else is present.  Do not use space heaters that can be accidentally overturned.  When alone, avoid using step stools or ladders, and do not clean rooftop gutters.  Purchase power tools and motorized Risk manager which  have a safety switch that will stop the machine if you release the handle (a 'dead man's' switch).   Driving and Transportation DO NOT DRIVE UNTIL YOU ARE CLEARED BY A NEUROLOGIST and/or you have permission to drive from your state's Department of Motor Vehicles  [facility]). Each state has different laws. Please refer to the following link on the Epilepsy Foundation of America's website for more information: http://www.epilepsyfoundation.org/answerplace/Social/driving/drivingu.cfm  If you ride a bicycle, wear a helmet and any other necessary protective gear.  When taking public transportation like the bus or subway, stay clear of the platform edge.   Outdoor Theatre manager is okay, but does present certain risks. Never swim alone, and tell friends what to do if you have a seizure while swimming.  Wear appropriate protective equipment.  Ski with a friend. If a seizure occurs, your friend can seek help, if needed. He or she can also help to get you out of the cold. Consider using a safety hook or belt while riding the ski lift.

## 2018-05-21 NOTE — ED Triage Notes (Signed)
Pt comes from work after having a witnessed seizure about 5 mins long. Pt was post itcal with witnesses but AO with EMS. Pt has hx of seizures but does not take any meds bc of side effects.

## 2018-05-23 ENCOUNTER — Other Ambulatory Visit: Payer: Self-pay

## 2018-05-23 ENCOUNTER — Encounter: Payer: Self-pay | Admitting: Neurology

## 2018-05-23 ENCOUNTER — Ambulatory Visit (INDEPENDENT_AMBULATORY_CARE_PROVIDER_SITE_OTHER): Payer: Self-pay | Admitting: Neurology

## 2018-05-23 VITALS — BP 126/64 | HR 76 | Ht 74.0 in | Wt 151.0 lb

## 2018-05-23 DIAGNOSIS — G40009 Localization-related (focal) (partial) idiopathic epilepsy and epileptic syndromes with seizures of localized onset, not intractable, without status epilepticus: Secondary | ICD-10-CM

## 2018-05-23 MED ORDER — LAMOTRIGINE 100 MG PO TABS: ORAL_TABLET | ORAL | 11 refills | Status: DC

## 2018-05-23 NOTE — Progress Notes (Signed)
NEUROLOGY FOLLOW UP OFFICE NOTE  Ray Hill [id number]  DOB: April 30, 1975  HISTORY OF PRESENT ILLNESS: I had the pleasure of seeing Ray Hill in follow-up in the neurology clinic on 05/23/2018.  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 He is alone in the office today. He presents for an urgent visit after seizure on 05/21/2018 that occurred in wakefulness. Majority of his seizures are nocturnal. He had been seizure-free since June 2018 on Lamotrigine, then decided to stop medication due to changes in mood/behavior and appetite. He reports stopping Lamotrigine in November. He had a nocturnal seizure a month ago as well. The seizure 2 days ago occurred at work, he had a right-sided headache that day, felt the deja vu, then woke up on the floor. No tongue bite or incontinence. He denies any other deja vu episodes. He denies any further headaches, dizziness, vision changes, focal numbness/tingling/weakness.   HPI: This is a 43 yo RH man with a history of seizures since age 43. His mother has witnessed the seizures that occur in the early morning hours out of sleep. His mother would hear him convulsing, he would struggle to get out of the bed and be confused for 30 minutes or so, or go back to sleep. Seizures last 5-10 minutes. They were occurring approximately every 4 months. He was initially seen by Dr. Terrace Hill, records unavailable for review. He was evaluated at [facility] in January 2015, where he reported that alcohol consumption had increased but was unable to correlate if seizures increased around more alcohol intake. He reported chronic left shoulder pain due to falling out of the bed after a seizure a few years ago, unable to lift his arm a little more than above the shoulder. He had an MRI brain on 04/28/13 which reported a few subcortical and deep cerebral white matter punctate foci of increased T2/FLAIR signal, otherwise unremarkable. Images unavailable for  review. He had a routine EEG which was abnormal, with right temporal slowing and sharp waves, as well as capturing one electroclinical seizure arising from the right temporal region with report of hand and mouth automatisms that arose out of drowsiness. He was started on low dose Zonegran and had been taking 100mg /day. He had a nocturnal seizure on 01/01/14, then another seizure on 01/02/14 which is the first time it occurred while awake. He recalls having a sensation of deja vu 15 to 20 minutes prior the seizure, then was amnestic of the event. He denied any prior sleep deprivation and had reduced alcohol intake. He was [facility] to [facility] where he was found to have a small amount of subarachnoid hemorrhage in the right sylvian fissure without mass effect. Repeat head CT on 10/11 noted that this was much less well seen. He was evaluated and cleared from neurosurgical standpoint. His Zonegran was increased to 200mg /day. He had another seizure while admitted to the hospital on 01/04/14 with confusion and picking at his clothing lasting 2 minutes. He denies any post-ictal focal weakness.   He reports having deja vu around once a month. He denies anyolfactory/gustatory hallucinations, rising epigastric sensation, focal numbness/tingling/weakness, myoclonic jerks. He and his mother report problems with his memory. He is not driving and is currently unemployed. His mother has noticed increased irritability, moodiness, and agitation since starting Zonegran. He denies any suicidal or homicidal ideation.  Epilepsy Risk Factors: He had a normal birth and early development. There is no history of febrile convulsions, CNS infections such as meningitis/encephalitis,  significant traumatic brain injury, neurosurgical procedures, or family history of seizures.  EEGs: 06/02/13 at [facility]: Intermittent right temporal delta slowing is seen. Frequent right temporal sharp waves are seen, maximal at F8; a brief semi-periodic run of  these at 0.5 to 1Hz  occurs in the post hyperventilation period. During the recording, the patient has one electroclinical seizure with hand and mouth automatisms that arose out of drowsiness. The EEG shows arousal from stage N1 sleep at 14:12:54, and after clinical onset, at 14:13:04 electrographic onset occurs with 7Hz  rhythmic theta frequenices in the right temporal region which quickly spread throughout the right hemisphere and then to the midline and left parasagittal derivations while slowing to 4.5Hz  rhythmic theta. The ictal rhythm then further evolved to somewhat sharply contoured delta frequencies with superimposed faster frequencies which were again more localized to the right hemisphere, maximal in the right temporal region and which slowed to around 2Hz  prior to offset at 14:14:05.  Prior AEDs: Zonisamide  PAST MEDICAL HISTORY: Past Medical History:  Diagnosis Date  . Seizures (HCC)     MEDICATIONS: Current Outpatient Medications on File Prior to Visit  Medication Sig Dispense Refill  . lamoTRIgine (LAMICTAL) 100 MG tablet Take 1 tablet in AM, 1.5 tablets in PM 75 tablet 11  . levETIRAcetam (KEPPRA) 500 MG tablet Take 1 tablet (500 mg total) by mouth 2 (two) times daily. 60 tablet 1  . sertraline (ZOLOFT) 25 MG tablet Take 1 tablet (25 mg total) by mouth daily. 30 tablet 5   No current facility-administered medications on file prior to visit.     ALLERGIES: No Known Allergies  FAMILY HISTORY: Family History  Problem Relation Age of Onset  . Cancer Maternal Grandmother        ovarian cancer     SOCIAL HISTORY: Social History   Socioeconomic History  . Marital status: Single    Spouse name: Not on file  . Number of children: Not on file  . Years of education: Not on file  . Highest education level: Not on file  Occupational History  . Occupation: Diplomatic Services operational officer Needs  . Financial resource strain: Not on file  . Food insecurity:    Worry: Not on file     Inability: Not on file  . Transportation needs:    Medical: Not on file    Non-medical: Not on file  Tobacco Use  . Smoking status: Current Every Day Smoker    Packs/day: 1.00    Types: Cigarettes  . Smokeless tobacco: Never Used  Substance and Sexual Activity  . Alcohol use: Yes    Alcohol/week: 0.0 standard drinks    Comment: rarely  . Drug use: Yes    Types: Marijuana  . Sexual activity: Not on file  Lifestyle  . Physical activity:    Days per week: Not on file    Minutes per session: Not on file  . Stress: Not on file  Relationships  . Social connections:    Talks on phone: Not on file    Gets together: Not on file    Attends religious service: Not on file    Active member of club or organization: Not on file    Attends meetings of clubs or organizations: Not on file    Relationship status: Not on file  . Intimate partner violence:    Fear of current or ex partner: Not on file    Emotionally abused: Not on file    Physically abused: Not on file  Forced sexual activity: Not on file  Other Topics Concern  . Not on file  Social History Narrative   Lives in [place] and works as a Office manager and works over night. Lives in a 2 story home. Does not have any problems with stairs. Lives with mother and sister.     REVIEW OF SYSTEMS: Constitutional: No fevers, chills, or sweats, no generalized fatigue, change in appetite Eyes: No visual changes, double vision, eye pain Ear, nose and throat: No hearing loss, ear pain, nasal congestion, sore throat Cardiovascular: No chest pain, palpitations Respiratory:  No shortness of breath at rest or with exertion, wheezes GastrointestinaI: No nausea, vomiting, diarrhea, abdominal pain, fecal incontinence Genitourinary:  No dysuria, urinary retention or frequency Musculoskeletal:  No neck pain, back pain Integumentary: No rash, pruritus, skin lesions Neurological: as above Psychiatric: + depression,no insomnia, anxiety Endocrine:  No palpitations, fatigue, diaphoresis, mood swings, change in appetite, change in weight, increased thirst Hematologic/Lymphatic:  No anemia, purpura, petechiae. Allergic/Immunologic: no itchy/runny eyes, nasal congestion, recent allergic reactions, rashes  PHYSICAL EXAM: Vitals:   05/23/18 1417  BP: 126/64  Pulse: 76  SpO2: 98%   General: No acute distress Head:  Normocephalic/atraumatic Neck: supple, no paraspinal tenderness, full range of motion Heart:  Regular rate and rhythm Lungs:  Clear to auscultation bilaterally Back: No paraspinal tenderness Skin/Extremities: No rash, no edema Neurological Exam: alert and oriented to person, place, and time. No aphasia or dysarthria. Fund of knowledge is appropriate.  Recent and remote memory are intact. Attention and concentration are normal.    Able to name objects and repeat phrases. Cranial nerves: Pupils equal, round, reactive to light. Extraocular movements intact with no nystagmus. Visual fields full. Facial sensation intact. No facial asymmetry. Tongue, uvula, palate midline.  Motor: Bulk and tone normal, muscle strength 5/5 throughout with no pronator drift.  Sensation to light touch intact.  No extinction to double simultaneous stimulation. Finger to nose testing intact.  Gait narrow-based and steady, able to tandem walk adequately.  Romberg negative.  IMPRESSION: This is a 43 yo RH man with right temporal lobe epilepsy. Majority of seizures are nocturnal, he has had only 2 seizures during wakefulness, most recently last 05/21/2018. He stopped Lamotrigine in November 2019 due to side effects, prior to last month, his last seizure was in June 2018. He understands need for AED, we discussed switching to a different AED such as oxcarbazepine, he would like to resume Lamotrigine  in AM,  in PM since he was doing well from seizure standpoint. Continue to monitor mood, he stopped Zoloft. We again discussed [place] driving laws, no driving until  6 months seizure-free. He will follow-up as scheduled in June and knows to call for any changes.   Thank you for allowing me to participate in his care.  Please do not hesitate to call for any questions or concerns.  The duration of this appointment visit was 25 minutes of face-to-face time with the patient.  Greater than 50% of this time was spent in counseling, explanation of diagnosis, planning of further management, and coordination of care.   Patrcia Dolly, M.D.

## 2018-05-23 NOTE — Patient Instructions (Addendum)
1. Restart Lamictal 100mg : Take 1 tablet in AM, 1.5 tablets in PM 2. As per [place] driving laws, no driving until 6 months seizure-free 3. Follow-up as scheduled in June, call for any changes.  Seizure Precautions: 1. If medication has been prescribed for you to prevent seizures, take it exactly as directed.  Do not stop taking the medicine without talking to your doctor first, even if you have not had a seizure in a long time.   2. Avoid activities in which a seizure would cause danger to yourself or to others.  Don't operate dangerous machinery, swim alone, or climb in high or dangerous places, such as on ladders, roofs, or girders.  Do not drive unless your doctor says you may.  3. If you have any warning that you may have a seizure, lay down in a safe place where you can't hurt yourself.    4.  No driving for 6 months from last seizure, as per [facility].   Please refer to the following link on the Epilepsy Foundation of America's website for more information: http://www.epilepsyfoundation.org/answerplace/Social/driving/drivingu.cfm   5.  Maintain good sleep hygiene. Avoid alcohol.  6.  Contact your doctor if you have any problems that may be related to the medicine you are taking.  7.  Call 911 and bring the patient back to the ED if:        A.  The seizure lasts longer than 5 minutes.       B.  The patient doesn't awaken shortly after the seizure  C.  The patient has new problems such as difficulty seeing, speaking or moving  D.  The patient was injured during the seizure  E.  The patient has a temperature over 102 F (39C)  F.  The patient vomited and now is having trouble breathing

## 2018-08-21 ENCOUNTER — Telehealth: Payer: Self-pay | Admitting: Neurology

## 2018-08-21 NOTE — Telephone Encounter (Signed)
[employer]V told patient his license was suspended. He was saying he didn't know this and that he wasn't aware of any paperwork that was needed to be filled out. He's wanting to know about if you have any info on this. Please call him back. Thanks!

## 2018-08-21 NOTE — Telephone Encounter (Signed)
Pt called no answer voice mail left for him to call back  

## 2018-08-21 NOTE — Telephone Encounter (Signed)
Pt called no answer 

## 2018-08-22 NOTE — Telephone Encounter (Signed)
Pt called last seizure was in March, I will look for [facility] paperwork Meagan charted about in Jan if unable to find I will print out another copy, pt knows that he cant drive until September he just wants to make sure he keeps insurance on his car,

## 2018-09-01 ENCOUNTER — Other Ambulatory Visit: Payer: Self-pay

## 2018-09-01 ENCOUNTER — Telehealth: Payer: Self-pay | Admitting: Neurology

## 2018-09-15 ENCOUNTER — Telehealth (INDEPENDENT_AMBULATORY_CARE_PROVIDER_SITE_OTHER): Payer: Self-pay | Admitting: Neurology

## 2018-09-15 ENCOUNTER — Encounter: Payer: Self-pay | Admitting: Neurology

## 2018-09-15 ENCOUNTER — Other Ambulatory Visit: Payer: Self-pay

## 2018-09-15 VITALS — Ht 74.0 in | Wt 155.0 lb

## 2018-09-15 DIAGNOSIS — G40009 Localization-related (focal) (partial) idiopathic epilepsy and epileptic syndromes with seizures of localized onset, not intractable, without status epilepticus: Secondary | ICD-10-CM

## 2018-09-15 MED ORDER — LAMOTRIGINE 100 MG PO TABS: ORAL_TABLET | ORAL | 11 refills | Status: DC

## 2018-09-15 NOTE — Progress Notes (Signed)
Virtual Visit via Video Note The purpose of this virtual visit is to provide medical care while limiting exposure to the novel coronavirus.    Consent was obtained for video visit:  Yes.   Answered questions that patient had about telehealth interaction:  Yes.   I discussed the limitations, risks, security and privacy concerns of performing an evaluation and management service by telemedicine. I also discussed with the patient that there may be a patient responsible charge related to this service. The patient expressed understanding and agreed to proceed.  Pt location: Home Physician Location: office Name of referring provider:  No ref. provider found I connected with Venora Maplesahim Somoza at patients initiation/request on 09/15/2018 at  2:30 PM EDT by video enabled telemedicine application and verified that I am speaking with the correct person using two identifiers. Pt MRN:  [id number] Pt DOB:  1975-12-25 Video Participants:  Venora Maplesahim Paras   History of Present Illness:  The patient was last seen in February 2020 for right temporal lobe epilepsy. He denies any seizures since the seizure at work on 05/21/2018. This occurred when he self-discontinued his Lamotrigine since he had been seizure-free for close to 2 years. We discussed that he needs to be on medication, he has been taking Lamotrigine 100mg  1 tab in AM, 1.5 tabs in PM with no further seizures since Feb 2020, no side effects. He was initially reporting mood and appetite changes on the medication, but states this has been okay lately. He is in good spirits, mood is good. He denies any deja vu episodes, no gaps in time, olfactory/gustatory hallucinations, focal numbness/tingling/weakness. No headaches, vision changes, no falls.   History on Initial Assessment 01/15/2014: This is a 43 yo RH man with a history of seizures since age 43. His mother has witnessed the seizures that occur in the early morning out of sleep. His mother would hear him  convulsing, he would struggle to get out of the bed and be confused for 30 minutes or so, or go back to sleep. Seizures last 5-10 minutes. They were occurring approximately every 4 months. He was initially seen by Dr. Terrace ArabiaYan, records unavailable for review. [facility], where he reported that alcohol consumption had increased but was unable to correlate if seizures increased around more alcohol intake. He reported chronic left shoulder pain due to falling out of the bed after a seizure a few years ago, unable to lift his arm a little more than above the shoulder. He had an MRI brain on 04/28/13 which reported a few subcortical and deep cerebral white matter punctate foci of increased T2/FLAIR signal, otherwise unremarkable. Images unavailable for review. He had a routine EEG which was abnormal, with right temporal slowing and sharp waves, as well as capturing one electroclinical seizure arising from the right temporal region with report of hand and mouth automatisms that arose out of drowsiness. He was started on low dose Zonegran and had been taking 100mg /day. He had a nocturnal seizure on 01/01/14, then another seizure on 01/02/14 which is the first time it occurred while awake. He recalls having a sensation of deja vu 15 to 20 minutes prior the seizure, then was amnestic of the event. He denied any prior sleep deprivation and had reduced alcohol intake. [facility] where he was found to have a small amount of subarachnoid hemorrhage in the right sylvian fissure without mass effect. Repeat head CT on 10/11 noted that this was much  less well seen. He was evaluated and cleared from neurosurgical standpoint. His Zonegran was increased to 200mg /day. He had another seizure while admitted to the hospital on 01/04/14 with confusion and picking at his clothing lasting 2 minutes. He denies any post-ictal focal weakness.   He reports having deja vu around once a month. He  denies anyolfactory/gustatory hallucinations, rising epigastric sensation, focal numbness/tingling/weakness, myoclonic jerks. He and his mother report problems with his memory. He is not driving and is currently unemployed. His mother has noticed increased irritability, moodiness, and agitation since starting Zonegran. He denies any suicidal or homicidal ideation.  Epilepsy Risk Factors: He had a normal birth and early development. There is no history of febrile convulsions, CNS infections such as meningitis/encephalitis, significant traumatic brain injury, neurosurgical procedures, or family history of seizures.  EEGs: 06/02/13 at [facility]: Intermittent right temporal delta slowing is seen. Frequent right temporal sharp waves are seen, maximal at F8; a brief semi-periodic run of these at 0.5 to 1Hz  occurs in the post hyperventilation period. During the recording, the patient has one electroclinical seizure with hand and mouth automatisms that arose out of drowsiness. The EEG shows arousal from stage N1 sleep at 14:12:54, and after clinical onset, at 14:13:04 electrographic onset occurs with 7Hz  rhythmic theta frequenices in the right temporal region which quickly spread throughout the right hemisphere and then to the midline and left parasagittal derivations while slowing to 4.5Hz  rhythmic theta. The ictal rhythm then further evolved to somewhat sharply contoured delta frequencies with superimposed faster frequencies which were again more localized to the right hemisphere, maximal in the right temporal region and which slowed to around 2Hz  prior to offset at 14:14:05.  Prior AEDs: Zonisamide    Current Outpatient Medications on File Prior to Visit  Medication Sig Dispense Refill  . lamoTRIgine (LAMICTAL) 100 MG tablet Take 1 tablet in AM, 1.5 tablets in PM 75 tablet 11   No current facility-administered medications on file prior to visit.      Observations/Objective:   Vitals:   09/15/18 1242   Weight: 155 lb (70.3 kg)  Height: 6\' 2"  (1.88 m)   GEN:  The patient appears stated age and is in NAD.  Neurological examination: Patient is awake, alert, oriented x 3. No aphasia or dysarthria. Intact fluency and comprehension. Remote and recent memory intact. Able to name and repeat. Cranial nerves: Extraocular movements intact with no nystagmus. No facial asymmetry. Motor: moves all extremities symmetrically, at least anti-gravity x 4. No incoordination on finger to nose testing. Gait: narrow-based and steady, able to tandem walk adequately. Negative Romberg test.   Assessment and Plan:   This is a 43 yo RH man with right temporal lobe epilepsy. Majority of seizures are nocturnal, he has had only 2 seizures during wakefulness, most recently last 05/21/2018 at work, this occurred after he stopped medication. He has been taking Lamotrigine 100mg  1 tab in AM, 1.5 tabs in PM regularly since then with no seizure recurrence. His longest seizure-free interval was close to 2 years. We again discussed [place] driving laws, no driving until 6 months seizure-free. He will follow-up in 6 months and knows to call for any changes.    Follow Up Instructions:    -I discussed the assessment and treatment plan with the patient. The patient was provided an opportunity to ask questions and all were answered. The patient agreed with the plan and demonstrated an understanding of the instructions.   The patient was advised to call back or seek  an in-person evaluation if the symptoms worsen or if the condition fails to improve as anticipated.     Cameron Sprang, MD

## 2018-11-11 ENCOUNTER — Encounter (HOSPITAL_COMMUNITY): Payer: Self-pay | Admitting: Emergency Medicine

## 2018-11-11 ENCOUNTER — Other Ambulatory Visit: Payer: Self-pay

## 2018-11-11 ENCOUNTER — Emergency Department (HOSPITAL_COMMUNITY)
Admission: EM | Admit: 2018-11-11 | Discharge: 2018-11-11 | Disposition: A | Discharge status: Home / Self Care | Payer: Self-pay | Attending: Emergency Medicine | Admitting: Emergency Medicine

## 2018-11-11 DIAGNOSIS — K529 Noninfective gastroenteritis and colitis, unspecified: Secondary | ICD-10-CM | POA: Insufficient documentation

## 2018-11-11 DIAGNOSIS — G40209 Localization-related (focal) (partial) symptomatic epilepsy and epileptic syndromes with complex partial seizures, not intractable, without status epilepticus: Secondary | ICD-10-CM | POA: Insufficient documentation

## 2018-11-11 DIAGNOSIS — F17210 Nicotine dependence, cigarettes, uncomplicated: Secondary | ICD-10-CM | POA: Insufficient documentation

## 2018-11-11 LAB — LIPASE, BLOOD: Lipase: 32 U/L (ref 11–51)

## 2018-11-11 LAB — URINALYSIS, ROUTINE W REFLEX MICROSCOPIC
Bacteria, UA: NONE SEEN
Bilirubin Urine: NEGATIVE
Glucose, UA: NEGATIVE mg/dL
Ketones, ur: 80 mg/dL — AB
Leukocytes,Ua: NEGATIVE
Nitrite: NEGATIVE
Protein, ur: 30 mg/dL — AB
Specific Gravity, Urine: 1.029 (ref 1.005–1.030)
pH: 5 (ref 5.0–8.0)

## 2018-11-11 LAB — CBC
HCT: 51.7 % (ref 39.0–52.0)
Hemoglobin: 16.9 g/dL (ref 13.0–17.0)
MCH: 31.8 pg (ref 26.0–34.0)
MCHC: 32.7 g/dL (ref 30.0–36.0)
MCV: 97.2 fL (ref 80.0–100.0)
Platelets: 239 10*3/uL (ref 150–400)
RBC: 5.32 MIL/uL (ref 4.22–5.81)
RDW: 12.9 % (ref 11.5–15.5)
WBC: 7.7 10*3/uL (ref 4.0–10.5)
nRBC: 0 % (ref 0.0–0.2)

## 2018-11-11 LAB — COMPREHENSIVE METABOLIC PANEL
ALT: 18 U/L (ref 0–44)
AST: 20 U/L (ref 15–41)
Albumin: 4.3 g/dL (ref 3.5–5.0)
Alkaline Phosphatase: 81 U/L (ref 38–126)
Anion gap: 11 (ref 5–15)
BUN: 19 mg/dL (ref 6–20)
CO2: 26 mmol/L (ref 22–32)
Calcium: 9.9 mg/dL (ref 8.9–10.3)
Chloride: 103 mmol/L (ref 98–111)
Creatinine, Ser: 1.11 mg/dL (ref 0.61–1.24)
GFR calc Af Amer: >60 mL/min (ref >60)
GFR calc non Af Amer: >60 mL/min (ref >60)
Glucose, Bld: 119 mg/dL — ABNORMAL HIGH (ref 70–99)
Potassium: 4.6 mmol/L (ref 3.5–5.1)
Sodium: 140 mmol/L (ref 135–145)
Total Bilirubin: 0.6 mg/dL (ref 0.3–1.2)
Total Protein: 7.4 g/dL (ref 6.5–8.1)

## 2018-11-11 MED ORDER — SODIUM CHLORIDE 0.9% FLUSH
3.0000 mL | Freq: Once | INTRAVENOUS | Status: AC
  Administered 2018-11-11: 16:00:00 3 mL via INTRAVENOUS

## 2018-11-11 MED ORDER — SODIUM CHLORIDE 0.9 % IV BOLUS
1000.0000 mL | Freq: Once | INTRAVENOUS | Status: AC
  Administered 2018-11-11: 16:00:00 1000 mL via INTRAVENOUS

## 2018-11-11 MED ORDER — METOCLOPRAMIDE HCL 5 MG/ML IJ SOLN
10.0000 mg | Freq: Once | INTRAMUSCULAR | Status: AC
  Administered 2018-11-11: 10 mg via INTRAVENOUS
  Filled 2018-11-11: qty 2

## 2018-11-11 MED ORDER — ONDANSETRON HCL 4 MG PO TABS: 4.0000 mg | ORAL_TABLET | Freq: Four times a day (QID) | ORAL | 0 refills | Status: DC

## 2018-11-11 MED ORDER — ONDANSETRON HCL 4 MG/2ML IJ SOLN
4.0000 mg | Freq: Once | INTRAMUSCULAR | Status: AC
  Administered 2018-11-11: 16:00:00 4 mg via INTRAVENOUS
  Filled 2018-11-11: qty 2

## 2018-11-11 NOTE — ED Triage Notes (Signed)
Patient reports sudden onset generalized/mid abdominal pain with N/V/D this morning about 9am. Reports hx kidney stones but denies urinary symptoms or fevers/chills.

## 2018-11-11 NOTE — Discharge Instructions (Signed)
Please read attached information. If you experience any new or worsening signs or symptoms please return to the emergency room for evaluation. Please follow-up with your primary care provider or specialist as discussed. Please use medication prescribed only as directed and discontinue taking if you have any concerning signs or symptoms.   °

## 2018-11-11 NOTE — ED Notes (Signed)
Patient verbalizes understanding of discharge instructions. Opportunity for questioning and answers were provided. Armband removed by staff, pt discharged from ED ambulatory.   

## 2018-11-11 NOTE — ED Provider Notes (Signed)
[facility] EMERGENCY DEPARTMENT Provider Note   CSN: [id number] Arrival date & time: 11/11/18  1146    History   Chief Complaint Chief Complaint  Patient presents with  . Abdominal Pain    HPI Ray Hill is a 43 y.o. male.     HPI    43 year old male presents today with complaints of abdominal pain.  Patient notes he woke up this morning with mid upper abdominal pain, nausea and vomiting.  He notes 3 episodes of loose stool today.  He had several episodes of vomiting while in the exam room.  He notes the pain is not severe presently.  He notes that he has had similar symptoms when he had a kidney stone in the past.  He denies any close sick contacts.  No medications prior to arrival.  No abnormal exposures.   Past Medical History:  Diagnosis Date  . Seizures [facility])     Patient Active Problem List   Diagnosis Date Noted  . Depression 12/01/2014  . Localization-related idiopathic epilepsy and epileptic syndromes with seizures of localized onset, not intractable, without status epilepticus ([place]) 01/20/2014  . SAH (subarachnoid hemorrhage) ([place]) 01/02/2014  . Complex partial seizures ([place]) 01/02/2014  . Acidosis, metabolic 37/16/9678  . History of seizures 02/16/2013  . Smoking 02/16/2013  . Marijuana use 02/16/2013    History reviewed. No pertinent surgical history.      Home Medications    Prior to Admission medications   Medication Sig Start Date End Date Taking? Authorizing Provider  lamoTRIgine (LAMICTAL) 100 MG tablet Take 1 tablet in AM, 1.5 tablets in PM 09/15/18   Cameron Sprang, MD  ondansetron (ZOFRAN) 4 MG tablet Take 1 tablet (4 mg total) by mouth every 6 (six) hours. 11/11/18   Okey Regal, PA-C    Family History Family History  Problem Relation Age of Onset  . Cancer Maternal Grandmother        ovarian cancer     Social History Social History   Tobacco Use  . Smoking status: Current Every Day Smoker    Packs/day:  1.00    Types: Cigarettes  . Smokeless tobacco: Never Used  Substance Use Topics  . Alcohol use: Yes    Alcohol/week: 0.0 standard drinks    Comment: rarely  . Drug use: Yes    Types: Marijuana     Allergies   Patient has no known allergies.   Review of Systems Review of Systems  All other systems reviewed and are negative.    Physical Exam Updated Vital Signs BP 127/77   Pulse (!) 52   Temp 97.7 F (36.5 C) (Oral)   Resp 14   SpO2 98%   Physical Exam Vitals signs and nursing note reviewed.  Constitutional:      Appearance: He is well-developed.  HENT:     Head: Normocephalic and atraumatic.  Eyes:     General: No scleral icterus.       Right eye: No discharge.        Left eye: No discharge.     Conjunctiva/sclera: Conjunctivae normal.     Pupils: Pupils are equal, round, and reactive to light.  Neck:     Musculoskeletal: Normal range of motion.     Vascular: No JVD.     Trachea: No tracheal deviation.  Pulmonary:     Effort: Pulmonary effort is normal.     Breath sounds: No stridor.  Abdominal:     Comments: Minimal tenderness the  epigastric region remainder abdomen soft nontender  Neurological:     Mental Status: He is alert and oriented to person, place, and time.     Coordination: Coordination normal.  Psychiatric:        Behavior: Behavior normal.        Thought Content: Thought content normal.        Judgment: Judgment normal.      ED Treatments / Results  Labs (all labs ordered are listed, but only abnormal results are displayed) Labs Reviewed  COMPREHENSIVE METABOLIC PANEL - Abnormal; Notable for the following components:      Result Value   Glucose, Bld 119 (*)    All other components within normal limits  URINALYSIS, ROUTINE W REFLEX MICROSCOPIC - Abnormal; Notable for the following components:   Hgb urine dipstick MODERATE (*)    Ketones, ur 80 (*)    Protein, ur 30 (*)    All other components within normal limits  LIPASE, BLOOD   CBC    EKG None  Radiology No results found.  Procedures Procedures (including critical care time)  Medications Ordered in ED Medications  sodium chloride flush (NS) 0.9 % injection 3 mL (3 mLs Intravenous Given 11/11/18 1537)  sodium chloride 0.9 % bolus 1,000 mL (0 mLs Intravenous Stopped 11/11/18 1648)  ondansetron (ZOFRAN) injection 4 mg (4 mg Intravenous Given 11/11/18 1537)  metoCLOPramide (REGLAN) injection 10 mg (10 mg Intravenous Given 11/11/18 1747)     Initial Impression / Assessment and Plan / ED Course  I have reviewed the triage vital signs and the nursing notes.  Pertinent labs & imaging results that were available during my care of the patient were reviewed by me and considered in my medical decision making (see chart for details).          Assessment/Plan: 43 year old male presents today with likely viral gastroenteritis.  He has no signs of acute life-threatening etiology.  He has no signs of kidney stones, perforation, dissection, appendicitis diverticulitis.  He had improvement in symptoms with medications here tolerating p.o.  He return if symptoms persist or they worsen.  He verbalized understanding and agreement to today's plan had no further questions or concerns.   Final Clinical Impressions(s) / ED Diagnoses   Final diagnoses:  Gastroenteritis    ED Discharge Orders         Ordered    ondansetron (ZOFRAN) 4 MG tablet  Every 6 hours     11/11/18 1611           Eyvonne MechanicHedges, Huie Ghuman, PA-C 11/12/18 1834    Gerhard MunchLockwood, Robert, MD 11/13/18 [phone]

## 2018-11-21 ENCOUNTER — Telehealth: Payer: Self-pay | Admitting: Neurology

## 2018-11-21 NOTE — Telephone Encounter (Signed)
Patient left msg with after hours about his license being cancelled and now he is able to get It back and is needing the office to send his paperwork to the [facility]. Thanks!

## 2018-11-21 NOTE — Telephone Encounter (Signed)
Pt states that he received a letter in the mail from his insurance stating that his license has been cancelled.  He was unaware that this was going to happen.  Per pt his last seizure was in Feb 2020. He was unable to tell me the last date.  I informed pt that we will complete the DMV forms and reminded him that it is the [place] law that he has to be 61m seizure free and that it is not up to Dr. Delice Lesch.

## 2018-11-24 ENCOUNTER — Telehealth: Payer: Self-pay | Admitting: Neurology

## 2018-11-24 NOTE — Telephone Encounter (Signed)
Pharmacy changed to HT at [facility]

## 2018-11-24 NOTE — Telephone Encounter (Signed)
Patient is wanting to update his pharm on file to the [facility] on [address] in [place]. Thanks!

## 2018-11-25 ENCOUNTER — Other Ambulatory Visit: Payer: Self-pay

## 2018-11-25 ENCOUNTER — Telehealth: Payer: Self-pay | Admitting: Neurology

## 2018-11-25 DIAGNOSIS — G40009 Localization-related (focal) (partial) idiopathic epilepsy and epileptic syndromes with seizures of localized onset, not intractable, without status epilepticus: Secondary | ICD-10-CM

## 2018-11-25 MED ORDER — LAMOTRIGINE 100 MG PO TABS: ORAL_TABLET | ORAL | 3 refills | Status: DC

## 2018-11-25 NOTE — Telephone Encounter (Signed)
Lamictal   [facility] on [address]

## 2018-11-25 NOTE — Telephone Encounter (Signed)
Lamictal #90 with 3 refills sent to pharmacy

## 2018-11-25 NOTE — Telephone Encounter (Signed)
The patient left a voice mail without that information.

## 2018-11-25 NOTE — Telephone Encounter (Signed)
What prescription does he need?

## 2018-11-25 NOTE — Telephone Encounter (Signed)
Patient called and left a message stating he'd like a prescription sent to the pharmacy.

## 2019-04-08 ENCOUNTER — Encounter: Payer: Self-pay | Admitting: Neurology

## 2019-04-09 ENCOUNTER — Telehealth (INDEPENDENT_AMBULATORY_CARE_PROVIDER_SITE_OTHER): Payer: Self-pay | Admitting: Neurology

## 2019-04-09 ENCOUNTER — Other Ambulatory Visit: Payer: Self-pay

## 2019-04-09 VITALS — Ht 74.0 in | Wt 160.0 lb

## 2019-04-09 DIAGNOSIS — G40009 Localization-related (focal) (partial) idiopathic epilepsy and epileptic syndromes with seizures of localized onset, not intractable, without status epilepticus: Secondary | ICD-10-CM

## 2019-04-09 NOTE — Progress Notes (Signed)
Virtual Visit via Video Note The purpose of this virtual visit is to provide medical care while limiting exposure to the novel coronavirus.    Consent was obtained for video visit:  Yes.   Answered questions that patient had about telehealth interaction:  Yes.   I discussed the limitations, risks, security and privacy concerns of performing an evaluation and management service by telemedicine. I also discussed with the patient that there may be a patient responsible charge related to this service. The patient expressed understanding and agreed to proceed.  Pt location: Home Physician Location: office Name of referring provider:  No ref. provider found I connected with Ray Hill at patients initiation/request on 04/09/2019 at  9:00 AM EST by video enabled telemedicine application and verified that I am speaking with the correct person using two identifiers. Pt MRN:  [id number] Pt DOB:  1975/05/09 Video Participants:  Ray Hill   History of Present Illness:  The patient was seen as a virtual video visit on 04/09/2019. 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 He continues to do well seizure-free since February 2020. He denies any staring/unresponsive episodes, gaps in time, olfactory/gustatory hallucinations, focal numbness/tingling/weakness, myoclonic jerks. No significant headaches, dizziness, diplopia, no falls. Mood is pretty good lately. Sleep is good. He denies any side effects on Lamotrigine 100mg  1 tab in AM, 1.5 tabs in PM.   History on Initial Assessment 01/15/2014: This is a 44 yo RH man with a history of seizures since age 40. His mother has witnessed the seizures that occur in the early morning out of sleep. His mother would hear him convulsing, he would struggle to get out of the bed and be confused for 30 minutes or so, or go back to sleep. Seizures last 5-10 minutes. They were occurring approximately every 4 months. He was initially seen by Dr. 38,  records unavailable for review. He was evaluated at [facility] in January 2015, where he reported that alcohol consumption had increased but was unable to correlate if seizures increased around more alcohol intake. He reported chronic left shoulder pain due to falling out of the bed after a seizure a few years ago, unable to lift his arm a little more than above the shoulder. He had an MRI brain on 04/28/13 which reported a few subcortical and deep cerebral white matter punctate foci of increased T2/FLAIR signal, otherwise unremarkable. Images unavailable for review. He had a routine EEG which was abnormal, with right temporal slowing and sharp waves, as well as capturing one electroclinical seizure arising from the right temporal region with report of hand and mouth automatisms that arose out of drowsiness. He was started on low dose Zonegran and had been taking 100mg /day. He had a nocturnal seizure on 01/01/14, then another seizure on 01/02/14 which is the first time it occurred while awake. He recalls having a sensation of deja vu 15 to 20 minutes prior the seizure, then was amnestic of the event. He denied any prior sleep deprivation and had reduced alcohol intake. He was brought to [facility] where he was found to have a small amount of subarachnoid hemorrhage in the right sylvian fissure without mass effect. Repeat head CT on 10/11 noted that this was much less well seen. He was evaluated and cleared from neurosurgical standpoint. His Zonegran was increased to 200mg /day. He had another seizure while admitted to the hospital on 01/04/14 with confusion and picking at his clothing lasting 2 minutes. He denies any post-ictal focal  weakness.   He reports having deja vu around once a month. He denies anyolfactory/gustatory hallucinations, rising epigastric sensation, focal numbness/tingling/weakness, myoclonic jerks. He and his mother report problems with his memory. He is not driving and is currently unemployed. His  mother has noticed increased irritability, moodiness, and agitation since starting Zonegran. He denies any suicidal or homicidal ideation.  Epilepsy Risk Factors: He had a normal birth and early development. There is no history of febrile convulsions, CNS infections such as meningitis/encephalitis, significant traumatic brain injury, neurosurgical procedures, or family history of seizures.  EEGs: 06/02/13 at [facility]: Intermittent right temporal delta slowing is seen. Frequent right temporal sharp waves are seen, maximal at F8; a brief semi-periodic run of these at 0.5 to 1Hz  occurs in the post hyperventilation period. During the recording, the patient has one electroclinical seizure with hand and mouth automatisms that arose out of drowsiness. The EEG shows arousal from stage N1 sleep at 14:12:54, and after clinical onset, at 14:13:04 electrographic onset occurs with 7Hz  rhythmic theta frequenices in the right temporal region which quickly spread throughout the right hemisphere and then to the midline and left parasagittal derivations while slowing to 4.5Hz  rhythmic theta. The ictal rhythm then further evolved to somewhat sharply contoured delta frequencies with superimposed faster frequencies which were again more localized to the right hemisphere, maximal in the right temporal region and which slowed to around 2Hz  prior to offset at 14:14:05.  Prior AEDs: Zonisamide    Current Outpatient Medications on File Prior to Visit  Medication Sig Dispense Refill  . lamoTRIgine (LAMICTAL) 100 MG tablet Take 1 tablet in AM, 1.5 tablets in PM 90 tablet 3   No current facility-administered medications on file prior to visit.     Observations/Objective:   Vitals:   04/08/19 0951  Weight: 160 lb (72.6 kg)  Height: 6\' 2"  (1.88 m)   GEN:  The patient appears stated age and is in NAD.  Neurological examination: Patient is awake, alert, oriented x 3. No aphasia or dysarthria. Intact fluency and comprehension.  Remote and recent memory intact. Able to name and repeat. Cranial nerves: Extraocular movements intact with no nystagmus. No facial asymmetry. Motor: moves all extremities symmetrically, at least anti-gravity x 4. No incoordination on finger to nose testing. Gait: narrow-based and steady, able to tandem walk adequately. Negative Romberg test.   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 Majority of seizures are nocturnal, he has had only 2 seizures during wakefulness, most recently last 05/21/2018 at work, this occurred after he stopped medication. He continues to do well since then, no side effects on Lamotrigine 100mg  1 tab in AM, 1.5 tabs in PM. We again discussed avoidance of seizure triggers, including missing medication, alcohol, and sleep deprivation. He is aware of [place] driving laws to stop driving after a seizure until 6 months seizure-free. Follow-up in 6-7 months, he knows to call for any changes.    Follow Up Instructions:   -I discussed the assessment and treatment plan with the patient. The patient was provided an opportunity to ask questions and all were answered. The patient agreed with the plan and demonstrated an understanding of the instructions.   The patient was advised to call back or seek an in-person evaluation if the symptoms worsen or if the condition fails to improve as anticipated.     Cameron Sprang, MD

## 2019-04-21 ENCOUNTER — Ambulatory Visit: Payer: Self-pay | Admitting: Neurology

## 2019-06-05 ENCOUNTER — Other Ambulatory Visit: Payer: Self-pay | Admitting: Neurology

## 2019-06-05 DIAGNOSIS — G40009 Localization-related (focal) (partial) idiopathic epilepsy and epileptic syndromes with seizures of localized onset, not intractable, without status epilepticus: Secondary | ICD-10-CM

## 2019-10-13 ENCOUNTER — Other Ambulatory Visit: Payer: Self-pay

## 2019-10-13 ENCOUNTER — Ambulatory Visit (INDEPENDENT_AMBULATORY_CARE_PROVIDER_SITE_OTHER): Payer: Self-pay | Admitting: Neurology

## 2019-10-13 ENCOUNTER — Encounter: Payer: Self-pay | Admitting: Neurology

## 2019-10-13 VITALS — BP 128/85 | HR 103 | Ht 74.0 in | Wt 139.6 lb

## 2019-10-13 DIAGNOSIS — G40009 Localization-related (focal) (partial) idiopathic epilepsy and epileptic syndromes with seizures of localized onset, not intractable, without status epilepticus: Secondary | ICD-10-CM

## 2019-10-13 MED ORDER — LAMOTRIGINE 100 MG PO TABS: ORAL_TABLET | ORAL | 11 refills | Status: DC

## 2019-10-13 NOTE — Progress Notes (Signed)
NEUROLOGY FOLLOW UP OFFICE NOTE  Sosaia Pittinger [id number] 1975/08/02  HISTORY OF PRESENT ILLNESS: I had the pleasure of seeing Ray Hill in follow-up in the neurology clinic on 10/13/2019.  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 He is alone in the office today. He continues to do well and denies any seizures since February 2020. He denies any staring/unresponsive episodes, gaps in time, focal numbness/tingling/weakness, myoclonic jerks. Every once in a while he smells something which he thinks he already smelled earlier, no associated confusion. No dizziness, vision changes, no falls. He has headaches here and there. Mood and sleep are good. He is on Lamotrigine 100mg  1 tab in AM, 1.5 tabs in PM without side effects. He is driving. He is currently unemployed and had to move back in with his sister.  History on Initial Assessment 01/15/2014: This is a 44 yo RH man with a history of seizures since age 17. His mother has witnessed the seizures that occur in the early morning out of sleep. His mother would hear him convulsing, he would struggle to get out of the bed and be confused for 30 minutes or so, or go back to sleep. Seizures last 5-10 minutes. They were occurring approximately every 4 months. He was initially seen by Dr. 38, records unavailable for review. He was evaluated at [facility] in January 2015, where he reported that alcohol consumption had increased but was unable to correlate if seizures increased around more alcohol intake. He reported chronic left shoulder pain due to falling out of the bed after a seizure a few years ago, unable to lift his arm a little more than above the shoulder. He had an MRI brain on 04/28/13 which reported a few subcortical and deep cerebral white matter punctate foci of increased T2/FLAIR signal, otherwise unremarkable. Images unavailable for review. He had a routine EEG which was abnormal, with right temporal slowing and  sharp waves, as well as capturing one electroclinical seizure arising from the right temporal region with report of hand and mouth automatisms that arose out of drowsiness. He was started on low dose Zonegran and had been taking 100mg /day. He had a nocturnal seizure on 01/01/14, then another seizure on 01/02/14 which is the first time it occurred while awake. He recalls having a sensation of deja vu 15 to 20 minutes prior the seizure, then was amnestic of the event. He denied any prior sleep deprivation and had reduced alcohol intake. He was brought to [facility] where he was found to have a small amount of subarachnoid hemorrhage in the right sylvian fissure without mass effect. Repeat head CT on 10/11 noted that this was much less well seen. He was evaluated and cleared from neurosurgical standpoint. His Zonegran was increased to 200mg /day. He had another seizure while admitted to the hospital on 01/04/14 with confusion and picking at his clothing lasting 2 minutes. He denies any post-ictal focal weakness.   He reports having deja vu around once a month. He denies anyolfactory/gustatory hallucinations, rising epigastric sensation, focal numbness/tingling/weakness, myoclonic jerks. He and his mother report problems with his memory. He is not driving and is currently unemployed. His mother has noticed increased irritability, moodiness, and agitation since starting Zonegran. He denies any suicidal or homicidal ideation.  Epilepsy Risk Factors: He had a normal birth and early development. There is no history of febrile convulsions, CNS infections such as meningitis/encephalitis, significant traumatic brain injury, neurosurgical procedures, or family history of seizures.  EEGs:  06/02/13 at [facility]: Intermittent right temporal delta slowing is seen. Frequent right temporal sharp waves are seen, maximal at F8; a brief semi-periodic run of these at 0.5 to 1Hz  occurs in the post hyperventilation period. During the recording,  the patient has one electroclinical seizure with hand and mouth automatisms that arose out of drowsiness. The EEG shows arousal from stage N1 sleep at 14:12:54, and after clinical onset, at 14:13:04 electrographic onset occurs with 7Hz  rhythmic theta frequenices in the right temporal region which quickly spread throughout the right hemisphere and then to the midline and left parasagittal derivations while slowing to 4.5Hz  rhythmic theta. The ictal rhythm then further evolved to somewhat sharply contoured delta frequencies with superimposed faster frequencies which were again more localized to the right hemisphere, maximal in the right temporal region and which slowed to around 2Hz  prior to offset at 14:14:05.  Prior AEDs: Zonisamide PAST MEDICAL HISTORY: Past Medical History:  Diagnosis Date  . Seizures (HCC)     MEDICATIONS: Current Outpatient Medications on File Prior to Visit  Medication Sig Dispense Refill  . lamoTRIgine (LAMICTAL) 100 MG tablet TAKE ONE TABLET BY MOUTH IN THE MORNING, AND ONE AND A HALF TABLETS ONCE IN THE EVENING 90 tablet 2   No current facility-administered medications on file prior to visit.    ALLERGIES: No Known Allergies  FAMILY HISTORY: Family History  Problem Relation Age of Onset  . Cancer Maternal Grandmother        ovarian cancer     SOCIAL HISTORY: Social History   Socioeconomic History  . Marital status: Single    Spouse name: Not on file  . Number of children: 0  . Years of education: Not on file  . Highest education level: 12th grade  Occupational History  . Occupation: security  Tobacco Use  . Smoking status: Current Every Day Smoker    Packs/day: 1.00    Types: Cigarettes  . Smokeless tobacco: Never Used  Vaping Use  . Vaping Use: Never used  Substance and Sexual Activity  . Alcohol use: Yes    Alcohol/week: 0.0 standard drinks    Comment: rarely  . Drug use: Yes    Types: Marijuana  . Sexual activity: Not on file  Other  Topics Concern  . Not on file  Social History Narrative   Lives in [place] and works as a and works over night. Lives in a 2 story home. Does not have any problems with stairs. Lives with mother and sister.    Right handed    Social Determinants of Health   Financial Resource Strain:   . Difficulty of Paying Living Expenses:   Food Insecurity:   . Worried About in the Last Year:   . [place] in the Last Year:   Transportation Needs:   . Office manager (Medical):   Programme researcher, broadcasting/film/video Lack of Transportation (Non-Medical):   Physical Activity:   . Days of Exercise per Week:   . Minutes of Exercise per Session:   Stress:   . Feeling of Stress :   Social Connections:   . Frequency of Communication with Friends and Family:   . Frequency of Social Gatherings with Friends and Family:   . Attends Religious Services:   . Active Member of Clubs or Organizations:   . Attends Barista Meetings:   Freight forwarder Marital Status:   Intimate Partner Violence:   . Fear of Current or Ex-Partner:   .  Emotionally Abused:   Marland Kitchen Physically Abused:   . Sexually Abused:     PHYSICAL EXAM: Vitals:   10/13/19 1124  BP: 128/85  Pulse: (!) 103  SpO2: 98%   General: No acute distress Head:  Normocephalic/atraumatic Skin/Extremities: No rash, no edema Neurological Exam: alert and oriented to person, place, and time. No aphasia or dysarthria. Fund of knowledge is appropriate.  Recent and remote memory are intact.  Attention and concentration are normal.  Cranial nerves: Pupils equal, round, reactive to light.  Extraocular movements intact with no nystagmus. Visual fields full.  No facial asymmetry.  Motor: Bulk and tone normal, muscle strength 5/5 throughout with no pronator drift.  Finger to nose testing intact.  Gait narrow-based and steady, able to tandem walk adequately.  Romberg negative.   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 Majority  of seizures are nocturnal, he has had only 2 seizures during wakefulness, most recently last 05/21/2018 at work, this occurred after he stopped medication. He denies any further seizures since 04/2018, continue Lamotrigine 100mg  1 tab in AM, 1.5 tabs in PM. He is aware of [place] driving laws to stop driving after a seizure until 6 months seizure-free. Follow-up in 6-8 months, he knows to call for any changes.    Thank you for allowing me to participate in his care.  Please do not hesitate to call for any questions or concerns.   , M.D.

## 2019-10-13 NOTE — Patient Instructions (Signed)
Good seeing you. Continue Lamictal 100mg : Take 1 tab in AM, 1 and 1/2 tabs in PM. Follow-up in 6-8 months, call for any changes.  Seizure Precautions: 1. If medication has been prescribed for you to prevent seizures, take it exactly as directed.  Do not stop taking the medicine without talking to your doctor first, even if you have not had a seizure in a long time.   2. Avoid activities in which a seizure would cause danger to yourself or to others.  Don't operate dangerous machinery, swim alone, or climb in high or dangerous places, such as on ladders, roofs, or girders.  Do not drive unless your doctor says you may.  3. If you have any warning that you may have a seizure, lay down in a safe place where you can't hurt yourself.    4.  No driving for 6 months from last seizure, as per [facility].   Please refer to the following link on the Epilepsy Foundation of America's website for more information: http://www.epilepsyfoundation.org/answerplace/Social/driving/drivingu.cfm   5.  Maintain good sleep hygiene. Avoid alcohol.  6.  Contact your doctor if you have any problems that may be related to the medicine you are taking.  7.  Call 911 and bring the patient back to the ED if:        A.  The seizure lasts longer than 5 minutes.       B.  The patient doesn't awaken shortly after the seizure  C.  The patient has new problems such as difficulty seeing, speaking or moving  D.  The patient was injured during the seizure  E.  The patient has a temperature over 102 F (39C)  F.  The patient vomited and now is having trouble breathing

## 2020-05-25 ENCOUNTER — Other Ambulatory Visit: Payer: Self-pay

## 2020-05-25 ENCOUNTER — Encounter: Payer: Self-pay | Admitting: Neurology

## 2020-05-25 ENCOUNTER — Ambulatory Visit (INDEPENDENT_AMBULATORY_CARE_PROVIDER_SITE_OTHER): Payer: Self-pay | Admitting: Neurology

## 2020-05-25 VITALS — BP 112/74 | HR 84 | Ht 74.0 in | Wt 151.0 lb

## 2020-05-25 DIAGNOSIS — M25512 Pain in left shoulder: Secondary | ICD-10-CM

## 2020-05-25 DIAGNOSIS — G40009 Localization-related (focal) (partial) idiopathic epilepsy and epileptic syndromes with seizures of localized onset, not intractable, without status epilepticus: Secondary | ICD-10-CM

## 2020-05-25 MED ORDER — LAMOTRIGINE 100 MG PO TABS: ORAL_TABLET | ORAL | 11 refills | Status: DC

## 2020-05-25 NOTE — Addendum Note (Signed)
Addended by: Dimas Chyle on: 05/25/2020 10:41 AM   Modules accepted: Orders

## 2020-05-25 NOTE — Progress Notes (Signed)
NEUROLOGY FOLLOW UP OFFICE NOTE  Ray Hill [id number] [birth year]  HISTORY OF PRESENT ILLNESS: I had the pleasure of seeing Ray Hill in follow-up in the neurology clinic on 05/25/2020.  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 He is alone in the office today. He continues to do well and denies any seizures since February 2020 on Lamotrigine 100mg  in AM, 150mg  in PM, no side effects. He lives with his sister. He denies any staring/unresponsive episodes, gaps in time, olfactory/gustatory hallucinations, myoclonic jerks. No headaches, dizziness, no falls. He has some left arm numbness and has difficulty abducting the left arm above shoulder level. Mood is pretty good, sleep is good.    History on Initial Assessment 01/15/2014: This is a 45 yo RH man with a history of seizures since age 47. His mother has witnessed the seizures that occur in the early morning out of sleep. His mother would hear him convulsing, he would struggle to get out of the bed and be confused for 30 minutes or so, or go back to sleep. Seizures last 5-10 minutes. They were occurring approximately every 4 months. He was initially seen by Dr. 30, records unavailable for review. He was evaluated at [facility] in January 2015, where he reported that alcohol consumption had increased but was unable to correlate if seizures increased around more alcohol intake. He reported chronic left shoulder pain due to falling out of the bed after a seizure a few years ago, unable to lift his arm a little more than above the shoulder. He had an MRI brain on 04/28/13 which reported a few subcortical and deep cerebral white matter punctate foci of increased T2/FLAIR signal, otherwise unremarkable. Images unavailable for review. He had a routine EEG which was abnormal, with right temporal slowing and sharp waves, as well as capturing one electroclinical seizure arising from the right temporal region with report of hand  and mouth automatisms that arose out of drowsiness. He was started on low dose Zonegran and had been taking 100mg /Ray. He had a nocturnal seizure on 01/01/14, then another seizure on 01/02/14 which is the first time it occurred while awake. He recalls having a sensation of deja vu 15 to 20 minutes prior the seizure, then was amnestic of the event. He denied any prior sleep deprivation and had reduced alcohol intake. He was brought to [facility] where he was found to have a small amount of subarachnoid hemorrhage in the right sylvian fissure without mass effect. Repeat head CT on 10/11 noted that this was much less well seen. He was evaluated and cleared from neurosurgical standpoint. His Zonegran was increased to 200mg /Ray. He had another seizure while admitted to the hospital on 01/04/14 with confusion and picking at his clothing lasting 2 minutes. He denies any post-ictal focal weakness.   He reports having deja vu around once a month. He denies anyolfactory/gustatory hallucinations, rising epigastric sensation, focal numbness/tingling/weakness, myoclonic jerks. He and his mother report problems with his memory. He is not driving and is currently unemployed. His mother has noticed increased irritability, moodiness, and agitation since starting Zonegran. He denies any suicidal or homicidal ideation.  Epilepsy Risk Factors: He had a normal birth and early development. There is no history of febrile convulsions, CNS infections such as meningitis/encephalitis, significant traumatic brain injury, neurosurgical procedures, or family history of seizures.  EEGs: 06/02/13 at [facility]: Intermittent right temporal delta slowing is seen. Frequent right temporal sharp waves are seen, maximal at F8; a  brief semi-periodic run of these at 0.5 to 1Hz  occurs in the post hyperventilation period. During the recording, the patient has one electroclinical seizure with hand and mouth automatisms that arose out of drowsiness. The EEG shows  arousal from stage N1 sleep at 14:12:54, and after clinical onset, at 14:13:04 electrographic onset occurs with 7Hz  rhythmic theta frequenices in the right temporal region which quickly spread throughout the right hemisphere and then to the midline and left parasagittal derivations while slowing to 4.5Hz  rhythmic theta. The ictal rhythm then further evolved to somewhat sharply contoured delta frequencies with superimposed faster frequencies which were again more localized to the right hemisphere, maximal in the right temporal region and which slowed to around 2Hz  prior to offset at 14:14:05.  Prior AEDs: Zonisamide  PAST MEDICAL HISTORY: Past Medical History:  Diagnosis Date  . Seizures (HCC)     MEDICATIONS: Current Outpatient Medications on File Prior to Visit  Medication Sig Dispense Refill  . lamoTRIgine (LAMICTAL) 100 MG tablet TAKE ONE TABLET BY MOUTH IN THE MORNING, AND ONE AND A HALF TABLETS IN THE EVENING 75 tablet 11   No current facility-administered medications on file prior to visit.    ALLERGIES: No Known Allergies  FAMILY HISTORY: Family History  Problem Relation Age of Onset  . Cancer Maternal Grandmother        ovarian cancer     SOCIAL HISTORY: Social History   Socioeconomic History  . Marital status: Single    Spouse name: Not on file  . Number of children: 0  . Years of education: Not on file  . Highest education level: 12th grade  Occupational History  . Occupation: security  Tobacco Use  . Smoking status: Current Every Ray Smoker    Packs/Ray: 1.00    Types: Cigarettes  . Smokeless tobacco: Never Used  Vaping Use  . Vaping Use: Never used  Substance and Sexual Activity  . Alcohol use: Not Currently    Alcohol/week: 0.0 standard drinks    Comment: rarely  . Drug use: Yes    Types: Marijuana  . Sexual activity: Not on file  Other Topics Concern  . Not on file  Social History Narrative   Lives in [place] and works as a and works  over night. Lives in a 2 story home. Does not have any problems with stairs. Lives with mother and sister.    Right handed    Social Determinants of Health   Financial Resource Strain: Not on file  Food Insecurity: Not on file  Transportation Needs: Not on file  Physical Activity: Not on file  Stress: Not on file  Social Connections: Not on file  Intimate Partner Violence: Not on file     PHYSICAL EXAM: Vitals:   05/25/20 1001  BP: 112/74  Pulse: 84  SpO2: 99%   General: No acute distress Head:  Normocephalic/atraumatic Skin/Extremities: No rash, no edema Neurological Exam: alert and awake. No aphasia or dysarthria. Fund of knowledge is appropriate. Attention and concentration are normal.   Cranial nerves: Pupils equal, round. Extraocular movements intact with no nystagmus. Visual fields full.  No facial asymmetry.  Motor: Bulk and tone normal, muscle strength 5/5 throughout but unable to abduct left arm above shoulder level without pain. No pronator drift.   Finger to nose testing intact.  Gait narrow-based and steady, able to tandem walk adequately.  Romberg negative.   IMPRESSION: This is a 45 yo RH man with right temporal lobe epilepsy. Majority of  seizures are nocturnal, he has had only 2 seizures during wakefulness, last seizure was 05/21/2018 after he had stopped medication. He has been doing well back on Lamotrigine 100mg  in AM, 150mg  in PM, refills sent. He is aware of [place] driving laws to stop driving after a seizure until 6 months seizure-free. Referral to Ortho will be sent for left shoulder decreased range of motion. Follow-up in 8 months, he knows to call for any changes.     Thank you for allowing me to participate in his care.  Please do not hesitate to call for any questions or concerns.   , M.D.

## 2020-05-25 NOTE — Patient Instructions (Signed)
Good to see you!  1. Continue Lamotrigine 100mg : Take 1 tablet in AM, 1.5 tablets in PM  2. Referral will be sent to Ortho for the left shoulder  3. Follow-up in 8 months, call for any changes   Seizure Precautions: 1. If medication has been prescribed for you to prevent seizures, take it exactly as directed.  Do not stop taking the medicine without talking to your doctor first, even if you have not had a seizure in a long time.   2. Avoid activities in which a seizure would cause danger to yourself or to others.  Don't operate dangerous machinery, swim alone, or climb in high or dangerous places, such as on ladders, roofs, or girders.  Do not drive unless your doctor says you may.  3. If you have any warning that you may have a seizure, lay down in a safe place where you can't hurt yourself.    4.  No driving for 6 months from last seizure, as per [facility].   Please refer to the following link on the Epilepsy Foundation of America's website for more information: http://www.epilepsyfoundation.org/answerplace/Social/driving/drivingu.cfm   5.  Maintain good sleep hygiene. Avoid alcohol.  6.  Contact your doctor if you have any problems that may be related to the medicine you are taking.  7.  Call 911 and bring the patient back to the ED if:        A.  The seizure lasts longer than 5 minutes.       B.  The patient doesn't awaken shortly after the seizure  C.  The patient has new problems such as difficulty seeing, speaking or moving  D.  The patient was injured during the seizure  E.  The patient has a temperature over 102 F (39C)  F.  The patient vomited and now is having trouble breathing

## 2020-05-31 ENCOUNTER — Telehealth: Payer: Self-pay

## 2020-05-31 NOTE — Telephone Encounter (Signed)
Pt called informed that orth has been trying to call him to get him scheduled. Pt stated that he has the number, pt advised to call to get a new pt appointment scheduled,

## 2021-02-01 ENCOUNTER — Ambulatory Visit: Payer: Self-pay | Admitting: Neurology

## 2021-04-18 ENCOUNTER — Telehealth: Payer: Self-pay | Admitting: Neurology

## 2021-04-18 NOTE — Telephone Encounter (Signed)
Pt needs refill on lamicatal he is out of medication he uses the [facility]   He has appt on 04-20-21

## 2021-04-19 ENCOUNTER — Other Ambulatory Visit: Payer: Self-pay | Admitting: Neurology

## 2021-04-19 ENCOUNTER — Other Ambulatory Visit: Payer: Self-pay

## 2021-04-19 DIAGNOSIS — G40009 Localization-related (focal) (partial) idiopathic epilepsy and epileptic syndromes with seizures of localized onset, not intractable, without status epilepticus: Secondary | ICD-10-CM

## 2021-04-19 MED ORDER — LAMOTRIGINE 100 MG PO TABS: ORAL_TABLET | ORAL | 0 refills | Status: DC

## 2021-04-19 NOTE — Telephone Encounter (Signed)
Patient called for a refill on his lamictal. It was sent to the wrong pharmacy. It was sent to the [facility] [id number] - [address] [place] - [address]  [place] [address]   It needs to be sent to [facility] [address] [place]

## 2021-04-19 NOTE — Telephone Encounter (Signed)
Refill sent in for pt. 

## 2021-04-20 ENCOUNTER — Ambulatory Visit: Payer: 59 | Admitting: Neurology

## 2021-07-14 ENCOUNTER — Telehealth: Payer: Self-pay

## 2021-07-14 NOTE — Telephone Encounter (Signed)
Called patient and informed him that he will need an appt before Dr. Karel Jarvis can fill out his DMV forms because it has been a year before he has been seen. Patient verbalized understanding and is aware someone from the front will give him a call to schedule his appt.  ?

## 2021-07-14 NOTE — Telephone Encounter (Signed)
Need to let Ray Hill know Dr. Karel Jarvis has his DMV paperwork but since I have not seen him in over a year, needs a visit first. Will put him on the waitlist, thanks ?

## 2021-07-27 ENCOUNTER — Ambulatory Visit: Payer: 59 | Admitting: Neurology

## 2021-08-04 ENCOUNTER — Encounter: Payer: Self-pay | Admitting: Neurology

## 2021-08-04 ENCOUNTER — Ambulatory Visit: Payer: Self-pay | Admitting: Neurology

## 2021-08-04 DIAGNOSIS — Z029 Encounter for administrative examinations, unspecified: Secondary | ICD-10-CM

## 2021-10-12 ENCOUNTER — Encounter: Payer: Self-pay | Admitting: Neurology

## 2021-10-12 ENCOUNTER — Ambulatory Visit: Payer: BC Managed Care – PPO | Admitting: Neurology

## 2021-10-12 VITALS — BP 116/77 | HR 77 | Ht 74.0 in | Wt 142.0 lb

## 2021-10-12 DIAGNOSIS — G40009 Localization-related (focal) (partial) idiopathic epilepsy and epileptic syndromes with seizures of localized onset, not intractable, without status epilepticus: Secondary | ICD-10-CM

## 2021-10-12 DIAGNOSIS — Z029 Encounter for administrative examinations, unspecified: Secondary | ICD-10-CM

## 2021-10-12 NOTE — Patient Instructions (Signed)
Good to see you doing well. Continue Lamotrigine 100mg : take 1 tablet in AM, 1 and 1/2 tablets in PM. Call our office to update on pharmacy. Follow-up in 1 year, call for any changes.   Seizure Precautions: 1. If medication has been prescribed for you to prevent seizures, take it exactly as directed.  Do not stop taking the medicine without talking to your doctor first, even if you have not had a seizure in a long time.   2. Avoid activities in which a seizure would cause danger to yourself or to others.  Don't operate dangerous machinery, swim alone, or climb in high or dangerous places, such as on ladders, roofs, or girders.  Do not drive unless your doctor says you may.  3. If you have any warning that you may have a seizure, lay down in a safe place where you can't hurt yourself.    4.  No driving for 6 months from last seizure, as per [facility].   Please refer to the following link on the Epilepsy Foundation of America's website for more information: http://www.epilepsyfoundation.org/answerplace/Social/driving/drivingu.cfm   5.  Maintain good sleep hygiene. Avoid alcohol.  6.  Contact your doctor if you have any problems that may be related to the medicine you are taking.  7.  Call 911 and bring the patient back to the ED if:        A.  The seizure lasts longer than 5 minutes.       B.  The patient doesn't awaken shortly after the seizure  C.  The patient has new problems such as difficulty seeing, speaking or moving  D.  The patient was injured during the seizure  E.  The patient has a temperature over 102 F (39C)  F.  The patient vomited and now is having trouble breathing

## 2021-10-12 NOTE — Progress Notes (Signed)
NEUROLOGY FOLLOW UP OFFICE NOTE  Ray Hill [id number] 10/22/1975  HISTORY OF PRESENT ILLNESS: I had the pleasure of seeing Ray Hill in follow-up in the neurology clinic on 10/12/2021.  The patient was last seen over a year ago for right temporal lobe epilepsy. He is alone in the office today. Since his last visit, he continues to do well seizure-free since February 2020 on Lamotrigine 100mg  in AM, 150mg  in PM, no side effects. He lives alone. He denies any staring/unresponsive episodes, gaps in time, olfactory/gustatory hallucinations, focal numbness/tingling/weakness, myoclonic jerks, waking up with tongue bite/incontinence. No headaches, dizziness, vision changes, no falls. He gets 8 hours of sleep. Mood is okay. He works third shift and drives.    History on Initial Assessment 01/15/2014: This is a 46 yo RH man with a history of seizures since age 46. His mother has witnessed the seizures that occur in the early morning out of sleep. His mother would hear him convulsing, he would struggle to get out of the bed and be confused for 30 minutes or so, or go back to sleep. Seizures last 5-10 minutes. They were occurring approximately every 4 months.  He was initially seen by Dr. 30, records unavailable for review. He was evaluated at [facility] in January 2015, where he reported that alcohol consumption had increased but was unable to correlate if seizures increased around more alcohol intake. He reported chronic left shoulder pain due to falling out of the bed after a seizure a few years ago, unable to lift his arm a little more than above the shoulder.  He had an MRI brain on 04/28/13 which reported a few subcortical and deep cerebral white matter punctate foci of increased T2/FLAIR signal, otherwise unremarkable. Images unavailable for review. He had a routine EEG which was abnormal, with right temporal slowing and sharp waves, as well as capturing one electroclinical seizure arising from the  right temporal region with report of hand and mouth automatisms that arose out of drowsiness. He was started on low dose Zonegran and had been taking 100mg /day. He had a nocturnal seizure on 01/01/14, then another seizure on 01/02/14 which is the first time it occurred while awake. He recalls having a sensation of deja vu 15 to 20 minutes prior the seizure, then was amnestic of the event. He denied any prior sleep deprivation and had reduced alcohol intake. He was brought to [facility] where he was found to have a small amount of subarachnoid hemorrhage in the right sylvian fissure without mass effect. Repeat head CT on 10/11 noted that this was much less well seen. He was evaluated and cleared from neurosurgical standpoint. His Zonegran was increased to 200mg /day. He had another seizure while admitted to the hospital on 01/04/14 with confusion and picking at his clothing lasting 2 minutes. He denies any post-ictal focal weakness.   He reports having deja vu around once a month. He denies anyolfactory/gustatory hallucinations,  rising epigastric sensation, focal numbness/tingling/weakness, myoclonic jerks. He and his mother report problems with his memory. He is not driving and is currently unemployed. His mother has noticed increased irritability, moodiness, and agitation since starting Zonegran. He denies any suicidal or homicidal ideation.   Epilepsy Risk Factors:  He had a normal birth and early development.  There is no history of febrile convulsions, CNS infections such as meningitis/encephalitis, significant traumatic brain injury, neurosurgical procedures, or family history of seizures.  EEGs: 06/02/13 at [facility]: Intermittent right temporal delta slowing is seen. Frequent right temporal sharp  waves are seen, maximal at F8; a brief semi-periodic run of these at 0.5 to 1Hz  occurs in the post hyperventilation period. During the recording, the patient has one electroclinical seizure with hand and mouth automatisms  that arose out of drowsiness. The EEG shows arousal from stage N1 sleep at 14:12:54, and after clinical onset, at 14:13:04 electrographic onset occurs with 7Hz   rhythmic theta frequenices in the right temporal region which quickly spread throughout the right hemisphere and then to the midline and left parasagittal derivations while slowing to 4.5Hz  rhythmic theta. The ictal rhythm then further evolved to somewhat sharply contoured delta frequencies with superimposed faster frequencies which were again more localized to the right hemisphere, maximal in the right temporal region and which slowed to around 2Hz  prior to offset at 14:14:05.  Prior AEDs: Zonisamide  PAST MEDICAL HISTORY: Past Medical History:  Diagnosis Date   Seizures (HCC)     MEDICATIONS: Current Outpatient Medications on File Prior to Visit  Medication Sig Dispense Refill   lamoTRIgine (LAMICTAL) 100 MG tablet TAKE ONE TABLET BY MOUTH IN THE MORNING AND ONE AND HALF TABLETS IN THE EVENING 75 tablet 0   No current facility-administered medications on file prior to visit.    ALLERGIES: No Known Allergies  FAMILY HISTORY: Family History  Problem Relation Age of Onset   Stroke Father    Cancer Maternal Grandmother        ovarian cancer     SOCIAL HISTORY: Social History   Socioeconomic History   Marital status: Single    Spouse name: Not on file   Number of children: 0   Years of education: Not on file   Highest education level: 12th grade  Occupational History   Occupation: security  Tobacco Use   Smoking status: Every Day    Packs/day: 1.00    Types: Cigarettes   Smokeless tobacco: Never  Vaping Use   Vaping Use: Never used  Substance and Sexual Activity   Alcohol use: Not Currently    Alcohol/week: 12.0 standard drinks of alcohol    Types: 12 Cans of beer per week   Drug use: Yes    Types: Marijuana    Comment: occas   Sexual activity: Not on file  Other Topics Concern   Not on file  Social  History Narrative   Lives in [place] and works as a and works over night. Lives in a 2 story home. Does not have any problems with stairs. Lives with mother and sister.    Right handed    Caffeine rarely   Social Determinants of Health   Financial Resource Strain: Not on file  Food Insecurity: Not on file  Transportation Needs: Not on file  Physical Activity: Not on file  Stress: Not on file  Social Connections: Not on file  Intimate Partner Violence: Not on file     PHYSICAL EXAM: Vitals:   10/12/21 0921  BP: 116/77  Pulse: 77  SpO2: 99%   General: No acute distress Head:  Normocephalic/atraumatic Skin/Extremities: No rash, no edema Neurological Exam: alert and awake. No aphasia or dysarthria. Fund of knowledge is appropriate. Attention and concentration are normal.   Cranial nerves: Pupils equal, round. Extraocular movements intact with no nystagmus. Visual fields full.  No facial asymmetry.  Motor: Bulk and tone normal, muscle strength 5/5 throughout with no pronator drift.   Finger to nose testing intact.  Gait narrow-based and steady, able to tandem walk adequately.  Romberg negative.  IMPRESSION: This is a 46 yo RH man with right temporal lobe epilepsy. Majority of seizures are nocturnal, he has had only 2 seizures during wakefulness, last seizure was 05/21/2018 after he had stopped medication. He continues to do well seizure-free since February 2020 on Lamotrigine 100mg : 1 tablet in AM, 1.5 tablets in PM. He will call our office to update on pharmacy closer to his home. He is aware of [place] driving laws to stop driving after a seizure until 6 months seizure-free. Follow-up in 1 year, call for any changes.    Thank you for allowing me to participate in his care.  Please do not hesitate to call for any questions or concerns.    , M.D.

## 2022-01-15 ENCOUNTER — Ambulatory Visit: Payer: 59 | Admitting: Neurology

## 2022-04-11 ENCOUNTER — Encounter: Payer: Self-pay | Admitting: Neurology

## 2022-10-12 ENCOUNTER — Ambulatory Visit: Payer: BC Managed Care – PPO | Admitting: Neurology

## 2022-10-12 ENCOUNTER — Encounter: Payer: Self-pay | Admitting: Neurology

## 2022-10-12 DIAGNOSIS — Z029 Encounter for administrative examinations, unspecified: Secondary | ICD-10-CM

## 2022-10-15 ENCOUNTER — Ambulatory Visit: Payer: BC Managed Care – PPO | Admitting: Neurology

## 2023-04-30 ENCOUNTER — Emergency Department (HOSPITAL_BASED_OUTPATIENT_CLINIC_OR_DEPARTMENT_OTHER): Payer: BC Managed Care – PPO

## 2023-04-30 ENCOUNTER — Encounter (HOSPITAL_BASED_OUTPATIENT_CLINIC_OR_DEPARTMENT_OTHER): Payer: Self-pay | Admitting: Emergency Medicine

## 2023-04-30 ENCOUNTER — Emergency Department (HOSPITAL_BASED_OUTPATIENT_CLINIC_OR_DEPARTMENT_OTHER)
Admission: EM | Admit: 2023-04-30 | Discharge: 2023-04-30 | Disposition: A | Discharge status: Home / Self Care | Payer: BC Managed Care – PPO | Attending: Emergency Medicine | Admitting: Emergency Medicine

## 2023-04-30 ENCOUNTER — Other Ambulatory Visit: Payer: Self-pay

## 2023-04-30 DIAGNOSIS — Y92410 Unspecified street and highway as the place of occurrence of the external cause: Secondary | ICD-10-CM | POA: Diagnosis not present

## 2023-04-30 DIAGNOSIS — M542 Cervicalgia: Secondary | ICD-10-CM | POA: Insufficient documentation

## 2023-04-30 DIAGNOSIS — R519 Headache, unspecified: Secondary | ICD-10-CM | POA: Diagnosis not present

## 2023-04-30 DIAGNOSIS — M25552 Pain in left hip: Secondary | ICD-10-CM | POA: Insufficient documentation

## 2023-04-30 MED ORDER — IBUPROFEN 400 MG PO TABS
600.0000 mg | ORAL_TABLET | Freq: Once | ORAL | Status: AC
  Administered 2023-04-30: 600 mg via ORAL
  Filled 2023-04-30: qty 1

## 2023-04-30 MED ORDER — CYCLOBENZAPRINE HCL 10 MG PO TABS: 10.0000 mg | ORAL_TABLET | Freq: Two times a day (BID) | ORAL | 0 refills | Status: AC | PRN
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
## 2023-04-30 NOTE — ED Notes (Signed)
 Patient transported to CT

## 2023-04-30 NOTE — Discharge Instructions (Signed)
 It was a pleasure taking part in your care.  As discussed, you workup is reassuring.  Please follow-up with your PCP.  Please begin taking Flexeril  twice a day as needed for muscle pains and aches.  Please continue taking Tylenol  or ibuprofen  at home.  Follow-up with PCP.  Return to the ED with any new symptoms.
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
## 2024-02-06 ENCOUNTER — Encounter: Payer: Self-pay | Admitting: Neurology

## 2024-04-02 ENCOUNTER — Encounter: Payer: Self-pay | Admitting: Neurology

## 2024-04-02 ENCOUNTER — Ambulatory Visit (INDEPENDENT_AMBULATORY_CARE_PROVIDER_SITE_OTHER): Admitting: Neurology

## 2024-04-02 DIAGNOSIS — G40009 Localization-related (focal) (partial) idiopathic epilepsy and epileptic syndromes with seizures of localized onset, not intractable, without status epilepticus: Secondary | ICD-10-CM | POA: Diagnosis not present

## 2024-04-02 MED ORDER — LAMOTRIGINE 100 MG PO TABS: ORAL_TABLET | ORAL | 4 refills | Status: AC
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
## 2024-04-02 NOTE — Patient Instructions (Signed)
 It's always good to see you.  Continue Lamotrigine  100mg : take 1 tablet every morning, 1 and 1/2 tablets every evening  2. Start using an alarm on your phone and a pillbox to help with medication reminders. Have Lamictal  level done next week  3. Follow-up in 1 year, call for any changes   Seizure Precautions: 1. If medication has been prescribed for you to prevent seizures, take it exactly as directed.  Do not stop taking the medicine without talking to your doctor first, even if you have not had a seizure in a long time.   2. Avoid activities in which a seizure would cause danger to yourself or to others.  Don't operate dangerous machinery, swim alone, or climb in high or dangerous places, such as on ladders, roofs, or girders.  Do not drive unless your doctor says you may.  3. If you have any warning that you may have a seizure, lay down in a safe place where you can't hurt yourself.    4.  No driving for 6 months from last seizure, as per [place]  state law.   Please refer to the following link on the [employer]ile[employer] of [employer]'s website for more information: http://www.epilepsyfoundation.org/answerplace/Social/driving/drivingu.cfm   5.  Maintain good sleep hygiene. Avoid alcohol.  6.  Contact your doctor if you have any problems that may be related to the medicine you are taking.  7.  Call 911 and bring the patient back to the ED if:        A.  The seizure lasts longer than 5 minutes.       B.  The patient doesn't awaken shortly after the seizure  C.  The patient has new problems such as difficulty seeing, speaking or moving  D.  The patient was injured during the seizure  E.  The patient has a temperature over 102 F (39C)  F.  The patient vomited and now is having trouble breathing

## 2024-05-26 ENCOUNTER — Ambulatory Visit: Admitting: Neurology

## 2024-10-09 ENCOUNTER — Ambulatory Visit: Payer: Self-pay | Admitting: Neurology
